# Patient Record
Sex: Female | Born: 1955 | Race: White | Hispanic: No | Marital: Married | Smoking: Current every day smoker
Health system: Southern US, Community
[De-identification: ages and names within clinical notes are randomized; demographics above are authoritative.]

## PROBLEM LIST (undated history)

## (undated) DIAGNOSIS — Z8719 Personal history of other diseases of the digestive system: Secondary | ICD-10-CM

## (undated) DIAGNOSIS — K219 Gastro-esophageal reflux disease without esophagitis: Secondary | ICD-10-CM

## (undated) DIAGNOSIS — F319 Bipolar disorder, unspecified: Secondary | ICD-10-CM

## (undated) DIAGNOSIS — E039 Hypothyroidism, unspecified: Secondary | ICD-10-CM

## (undated) DIAGNOSIS — K5900 Constipation, unspecified: Secondary | ICD-10-CM

## (undated) DIAGNOSIS — R1310 Dysphagia, unspecified: Secondary | ICD-10-CM

## (undated) DIAGNOSIS — I10 Essential (primary) hypertension: Secondary | ICD-10-CM

## (undated) DIAGNOSIS — E7800 Pure hypercholesterolemia, unspecified: Secondary | ICD-10-CM

## (undated) DIAGNOSIS — Z972 Presence of dental prosthetic device (complete) (partial): Secondary | ICD-10-CM

## (undated) HISTORY — PX: SHOULDER SURGERY: SHX246

## (undated) HISTORY — PX: ABDOMINAL HYSTERECTOMY: SHX81

---

## 2006-09-14 HISTORY — PX: COLONOSCOPY: SHX174

## 2010-12-18 ENCOUNTER — Emergency Department: Payer: Self-pay | Admitting: Internal Medicine

## 2011-08-19 ENCOUNTER — Ambulatory Visit: Payer: Self-pay | Admitting: Family Medicine

## 2011-08-27 ENCOUNTER — Ambulatory Visit: Payer: Self-pay | Admitting: Family Medicine

## 2011-09-15 HISTORY — PX: BREAST SURGERY: SHX581

## 2012-02-22 ENCOUNTER — Ambulatory Visit: Payer: Self-pay | Admitting: Surgery

## 2012-04-11 ENCOUNTER — Ambulatory Visit: Payer: Self-pay | Admitting: Surgery

## 2012-09-14 HISTORY — PX: BREAST EXCISIONAL BIOPSY: SUR124

## 2013-04-11 ENCOUNTER — Emergency Department: Payer: Self-pay | Admitting: Emergency Medicine

## 2013-08-29 ENCOUNTER — Emergency Department: Payer: Self-pay | Admitting: Emergency Medicine

## 2013-09-24 ENCOUNTER — Emergency Department: Payer: Self-pay | Admitting: Emergency Medicine

## 2013-09-24 LAB — URINALYSIS, COMPLETE
BILIRUBIN, UR: NEGATIVE
Blood: NEGATIVE
Glucose,UR: NEGATIVE mg/dL (ref 0–75)
Ketone: NEGATIVE
Leukocyte Esterase: NEGATIVE
Nitrite: NEGATIVE
PROTEIN: NEGATIVE
Ph: 6 (ref 4.5–8.0)
RBC,UR: NONE SEEN /HPF (ref 0–5)
Specific Gravity: 1.01 (ref 1.003–1.030)

## 2014-04-18 ENCOUNTER — Ambulatory Visit: Payer: Self-pay | Admitting: Family Medicine

## 2015-01-01 NOTE — Op Note (Signed)
PATIENT NAME:  Brittany Guerra, BADMAN MR#:  [id number] DATE OF BIRTH:  [birth year]  DATE OF PROCEDURE:  04/11/2012  PREOPERATIVE DIAGNOSIS:  Cluster of microcalcifications left breast.   POSTOPERATIVE DIAGNOSIS:  Cluster of microcalcifications left breast.   PROCEDURE: Excision of left breast mass.   SURGEON: Rochel Brome, M.D.   ANESTHESIA: General.   INDICATIONS: This 59 year old female had a recent mammogram which depicted a small cluster of microcalcifications in the upper outer quadrant, left breast. X-ray needle localization and excision was recommended for further evaluation.   DESCRIPTION OF PROCEDURE: The patient was placed on the operating table in the supine position under general anesthesia. The dressing was removed from the left breast, exposing the Kopans wire which entered the breast in the upper outer quadrant at approximately the one o'clock position in the peripheral aspect of the breast. The wire was cut 3 cm from the skin. The breast was prepared with ChloraPrep and draped in a sterile manner. Mammograms were reviewed prior to incision. The microcalcifications appeared to be near the junction of the thickener with the wire most proximal.   A curvilinear incision was made in the upper outer quadrant of the left breast approximately 4 cm in length, carried down through subcutaneous tissues. Numerous small bleeding points were cauterized. Two bleeding points were suture ligated with 4-0 chromic. The incision was carried down to encounter the thin part of the wire and delivered the wire up into the wound. Next, a sample of tissue which was approximately 2 x 2 x 4 cm in dimension was removed including the wire. It was submitted for specimen mammogram. The wound was inspected. Several small bleeding points were cauterized. Hemostasis was subsequently intact. The subcutaneous tissues and some deeper tissues were infiltrated with 0.5% Sensorcaine with epinephrine. Subcutaneous tissues were  closed with 4-0 chromic and the skin was closed with running 5-0 Monocryl subcuticular suture and Dermabond. The specimen mammogram did demonstrate the microcalcifications and tissues were submitted for routine pathology.   The patient tolerated the procedure satisfactorily and was prepared for transfer to the recovery room.   ____________________________ Lenna Sciara. Rochel Brome, MD jws:bjt D: 04/11/2012 11:19:34 ET T: 04/11/2012 12:47:55 ET JOB#: [id number]  cc: Loreli Dollar, MD, <Dictator> Loreli Dollar MD ELECTRONICALLY SIGNED 04/16/2012 10:43

## 2015-06-06 ENCOUNTER — Other Ambulatory Visit: Payer: Self-pay | Admitting: Family Medicine

## 2015-06-06 DIAGNOSIS — R1310 Dysphagia, unspecified: Secondary | ICD-10-CM

## 2015-06-17 ENCOUNTER — Ambulatory Visit: Payer: Self-pay

## 2015-06-27 ENCOUNTER — Ambulatory Visit: Payer: Self-pay

## 2015-07-03 ENCOUNTER — Ambulatory Visit: Payer: Self-pay

## 2015-07-05 ENCOUNTER — Ambulatory Visit: Payer: Self-pay | Attending: Family Medicine

## 2016-01-24 ENCOUNTER — Encounter: Payer: Self-pay | Admitting: *Deleted

## 2016-01-28 NOTE — Discharge Instructions (Signed)

## 2016-01-29 ENCOUNTER — Encounter
Admission: RE | Disposition: A | Discharge status: Home / Self Care | Payer: Self-pay | Source: Ambulatory Visit | Attending: Gastroenterology

## 2016-01-29 ENCOUNTER — Ambulatory Visit: Admitting: Anesthesiology

## 2016-01-29 ENCOUNTER — Ambulatory Visit
Admission: RE | Admit: 2016-01-29 | Discharge: 2016-01-29 | Disposition: A | Discharge status: Home / Self Care | Source: Ambulatory Visit | Attending: Gastroenterology | Admitting: Gastroenterology

## 2016-01-29 DIAGNOSIS — I10 Essential (primary) hypertension: Secondary | ICD-10-CM | POA: Diagnosis not present

## 2016-01-29 DIAGNOSIS — E039 Hypothyroidism, unspecified: Secondary | ICD-10-CM | POA: Insufficient documentation

## 2016-01-29 DIAGNOSIS — E7800 Pure hypercholesterolemia, unspecified: Secondary | ICD-10-CM | POA: Insufficient documentation

## 2016-01-29 DIAGNOSIS — R1310 Dysphagia, unspecified: Secondary | ICD-10-CM | POA: Insufficient documentation

## 2016-01-29 DIAGNOSIS — D123 Benign neoplasm of transverse colon: Secondary | ICD-10-CM | POA: Insufficient documentation

## 2016-01-29 DIAGNOSIS — F319 Bipolar disorder, unspecified: Secondary | ICD-10-CM | POA: Diagnosis not present

## 2016-01-29 DIAGNOSIS — Z7982 Long term (current) use of aspirin: Secondary | ICD-10-CM | POA: Insufficient documentation

## 2016-01-29 DIAGNOSIS — Z90710 Acquired absence of both cervix and uterus: Secondary | ICD-10-CM | POA: Insufficient documentation

## 2016-01-29 DIAGNOSIS — K219 Gastro-esophageal reflux disease without esophagitis: Secondary | ICD-10-CM | POA: Insufficient documentation

## 2016-01-29 DIAGNOSIS — Z79899 Other long term (current) drug therapy: Secondary | ICD-10-CM | POA: Insufficient documentation

## 2016-01-29 DIAGNOSIS — K5900 Constipation, unspecified: Secondary | ICD-10-CM | POA: Diagnosis present

## 2016-01-29 DIAGNOSIS — Z882 Allergy status to sulfonamides status: Secondary | ICD-10-CM | POA: Diagnosis not present

## 2016-01-29 DIAGNOSIS — F17200 Nicotine dependence, unspecified, uncomplicated: Secondary | ICD-10-CM | POA: Diagnosis not present

## 2016-01-29 DIAGNOSIS — E785 Hyperlipidemia, unspecified: Secondary | ICD-10-CM | POA: Diagnosis not present

## 2016-01-29 HISTORY — DX: Essential (primary) hypertension: I10

## 2016-01-29 HISTORY — PX: COLONOSCOPY: SHX5424

## 2016-01-29 HISTORY — DX: Gastro-esophageal reflux disease without esophagitis: K21.9

## 2016-01-29 HISTORY — DX: Pure hypercholesterolemia, unspecified: E78.00

## 2016-01-29 HISTORY — DX: Bipolar disorder, unspecified: F31.9

## 2016-01-29 HISTORY — PX: ESOPHAGOGASTRODUODENOSCOPY: SHX5428

## 2016-01-29 HISTORY — DX: Hypothyroidism, unspecified: E03.9

## 2016-01-29 HISTORY — DX: Constipation, unspecified: K59.00

## 2016-01-29 HISTORY — DX: Personal history of other diseases of the digestive system: Z87.19

## 2016-01-29 HISTORY — DX: Presence of dental prosthetic device (complete) (partial): Z97.2

## 2016-01-29 HISTORY — DX: Dysphagia, unspecified: R13.10

## 2016-01-29 SURGERY — COLONOSCOPY
Anesthesia: Monitor Anesthesia Care | Wound class: Clean Contaminated

## 2016-01-29 MED ORDER — LACTATED RINGERS IV SOLN
INTRAVENOUS | Status: DC
  Administered 2016-01-29: 11:00:00 via INTRAVENOUS

## 2016-01-29 MED ORDER — PROPOFOL 10 MG/ML IV BOLUS
INTRAVENOUS | Status: DC | PRN
  Administered 2016-01-29 (×8): 20 mg via INTRAVENOUS

## 2016-01-29 MED ORDER — STERILE WATER FOR IRRIGATION IR SOLN
Status: DC | PRN
  Administered 2016-01-29: 400 mL

## 2016-01-29 SURGICAL SUPPLY — 41 items
BALLN DILATOR 10-12 8 (BALLOONS)
BALLN DILATOR 12-15 8 (BALLOONS)
BALLN DILATOR 15-18 8 (BALLOONS)
BALLN DILATOR CRE 0-12 8 (BALLOONS)
BALLN DILATOR ESOPH 8 10 CRE (MISCELLANEOUS) IMPLANT
BALLOON DILATOR 12-15 8 (BALLOONS) IMPLANT
BALLOON DILATOR 15-18 8 (BALLOONS) IMPLANT
BALLOON DILATOR CRE 0-12 8 (BALLOONS) IMPLANT
BLOCK BITE 60FR ADLT L/F GRN (MISCELLANEOUS) ×3 IMPLANT
CANISTER SUCT 1200ML W/VALVE (MISCELLANEOUS) ×3 IMPLANT
FCP ESCP3.2XJMB 240X2.8X (MISCELLANEOUS)
FORCEPS BIOP RAD 4 LRG CAP 4 (CUTTING FORCEPS) IMPLANT
FORCEPS BIOP RJ4 240 W/NDL (MISCELLANEOUS)
FORCEPS ESCP3.2XJMB 240X2.8X (MISCELLANEOUS) IMPLANT
GOWN CVR UNV OPN BCK APRN NK (MISCELLANEOUS) ×1 IMPLANT
GOWN ISOL THUMB LOOP REG UNIV (MISCELLANEOUS) ×2
GOWN STRL REUS W/ TWL LRG LVL3 (GOWN DISPOSABLE) ×1 IMPLANT
GOWN STRL REUS W/TWL LRG LVL3 (GOWN DISPOSABLE) ×2
HEMOCLIP INSTINCT (CLIP) IMPLANT
INJECTOR VARIJECT VIN23 (MISCELLANEOUS) IMPLANT
KIT CO2 TUBING (TUBING) IMPLANT
KIT DEFENDO VALVE AND CONN (KITS) IMPLANT
KIT ENDO PROCEDURE OLY (KITS) ×3 IMPLANT
LIGATOR MULTIBAND 6SHOOTER MBL (MISCELLANEOUS) IMPLANT
MARKER SPOT ENDO TATTOO 5ML (MISCELLANEOUS) IMPLANT
PAD GROUND ADULT SPLIT (MISCELLANEOUS) IMPLANT
SNARE SHORT THROW 13M SML OVAL (MISCELLANEOUS) ×3 IMPLANT
SNARE SHORT THROW 30M LRG OVAL (MISCELLANEOUS) IMPLANT
SPOT EX ENDOSCOPIC TATTOO (MISCELLANEOUS)
SUCTION POLY TRAP 4CHAMBER (MISCELLANEOUS) IMPLANT
SYR INFLATION 60ML (SYRINGE) IMPLANT
TRAP SUCTION POLY (MISCELLANEOUS) IMPLANT
TUBING CONN 6MMX3.1M (TUBING)
TUBING SUCTION CONN 0.25 STRL (TUBING) IMPLANT
UNDERPAD 30X60 958B10 (PK) (MISCELLANEOUS) IMPLANT
VALVE BIOPSY ENDO (VALVE) IMPLANT
VARIJECT INJECTOR VIN23 (MISCELLANEOUS)
WATER AUXILLARY (MISCELLANEOUS) IMPLANT
WATER STERILE IRR 250ML POUR (IV SOLUTION) ×3 IMPLANT
WATER STERILE IRR 500ML POUR (IV SOLUTION) IMPLANT
WIRE CRE 18-20MM 8CM F G (MISCELLANEOUS) IMPLANT

## 2016-01-29 NOTE — H&P (Signed)
Primary Care Physician:  Dion Body, MD Primary Gastroenterologist:  Dr. Candace Cruise  Pre-Procedure History & Physical: HPI:  Brittany Guerra is a 60 y.o. female is here for an EGD/colonoscopy  Past Medical History  Diagnosis Date  . Constipation     chronic  . Dysphagia   . GERD (gastroesophageal reflux disease)   . Hypercholesteremia   . Hypertension   . History of hiatal hernia   . Hypothyroidism   . Bipolar disorder ([place])   . Wears dentures     full upper and lower    Past Surgical History  Procedure Laterality Date  . Abdominal hysterectomy    . Breast surgery Left 2013    calcifications  . Colonoscopy  2008  . Shoulder surgery Left     Prior to Admission medications   Medication Sig Start Date End Date Taking? Authorizing Provider  albuterol (PROVENTIL HFA;VENTOLIN HFA) 108 (90 Base) MCG/ACT inhaler Inhale into the lungs every 6 (six) hours as needed for wheezing or shortness of breath.   Yes Historical Provider, MD  aspirin 81 MG tablet Take 81 mg by mouth daily.   Yes Historical Provider, MD  buPROPion (WELLBUTRIN SR) 100 MG 12 hr tablet Take 100 mg by mouth daily.   Yes Historical Provider, MD  DOCUSATE CALCIUM PO Take by mouth daily.   Yes Historical Provider, MD  esomeprazole (NEXIUM) 40 MG capsule Take 40 mg by mouth daily at 12 noon.   Yes Historical Provider, MD  levothyroxine (SYNTHROID, LEVOTHROID) 112 MCG tablet Take 112 mcg by mouth daily before breakfast.   Yes Historical Provider, MD  lithium 300 MG tablet Take 600 mg by mouth daily. Takes at bedtime   Yes Historical Provider, MD  metoprolol tartrate (LOPRESSOR) 25 MG tablet Take 25 mg by mouth 2 (two) times daily.   Yes Historical Provider, MD  risperiDONE (RISPERDAL) 1 MG tablet Take 1 mg by mouth at bedtime.   Yes Historical Provider, MD  sucralfate (CARAFATE) 1 g tablet Take 1 g by mouth 4 (four) times daily.   Yes Historical Provider, MD  TEMAZEPAM PO Take by mouth daily.   Yes Historical  Provider, MD  tolterodine (DETROL LA) 4 MG 24 hr capsule Take 4 mg by mouth daily.   Yes Historical Provider, MD    Allergies as of 12/06/2015  . (Not on File)    History reviewed. No pertinent family history.  Social History   Social History  . Marital Status: Married    Spouse Name: N/A  . Number of Children: N/A  . Years of Education: N/A   Occupational History  . Not on file.   Social History Main Topics  . Smoking status: Current Every Day Smoker    Types: E-cigarettes  . Smokeless tobacco: Former Systems developer    Quit date: 12/14/2011     Comment: Prior to 2013 smoked 3 PPD for 39 yrs.  Now uses "Vapes"  . Alcohol Use: 1.8 oz/week    3 Cans of beer per week  . Drug Use: Not on file  . Sexual Activity: Not on file   Other Topics Concern  . Not on file   Social History Narrative    Review of Systems: See HPI, otherwise negative ROS  Physical Exam: BP 129/74 mmHg  Pulse 58  Temp(Src) 98.6 F (37 C) (Temporal)  Resp 16  Ht 5\' 6"  (1.676 m)  Wt 145 lb (65.772 kg)  BMI 23.41 kg/m2  SpO2 100% General:  Alert,  pleasant and cooperative in NAD Head:  Normocephalic and atraumatic. Neck:  Supple; no masses or thyromegaly. Lungs:  Clear throughout to auscultation.    Heart:  Regular rate and rhythm. Abdomen:  Soft, nontender and nondistended. Normal bowel sounds, without guarding, and without rebound.   Neurologic:  Alert and  oriented x4;  grossly normal neurologically.  Impression/Plan: Brittany Guerra is here for an EGD/colonoscopy to be performed for GERD, dysphagia, chronic constipation  Risks, benefits, limitations, and alternatives regarding EGD/colonoscopy have been reviewed with the patient.  Questions have been answered.  All parties agreeable.   [name], [name], MD  01/29/2016, 11:33 AM

## 2016-01-29 NOTE — Op Note (Signed)
[facility] Gastroenterology Patient Name: Brittany Guerra Procedure Date: 01/29/2016 11:34 AM MRN: [id number] Account #: [id number] Date of Birth: 1955-09-27 Admit Type: Outpatient Age: 60 Room: [facility] OR ROOM 01 Gender: Female Note Status: Finalized Procedure:            Upper GI endoscopy Indications:          Dysphagia, Suspected esophageal reflux Providers:            [name]. Candace Cruise, MD Referring MD:         Dion Body (Referring MD) Medicines:            Monitored Anesthesia Care Complications:        No immediate complications. Procedure:            Pre-Anesthesia Assessment:                       - Prior to the procedure, a History and Physical was                        performed, and patient medications, allergies and                        sensitivities were reviewed. The patient's tolerance of                        previous anesthesia was reviewed.                       - The risks and benefits of the procedure and the                        sedation options and risks were discussed with the                        patient. All questions were answered and informed                        consent was obtained.                       - After reviewing the risks and benefits, the patient                        was deemed in satisfactory condition to undergo the                        procedure.                       After obtaining informed consent, the endoscope was                        passed under direct vision. Throughout the procedure,                        the patient's blood pressure, pulse, and oxygen                        saturations were monitored continuously. The Olympus  GIF H180J endoscope (S#: [id number]) was introduced                        through the mouth, and advanced to the second part of                        duodenum. The upper GI endoscopy was accomplished                        without  difficulty. Findings:      No endoscopic abnormality was evident in the esophagus to explain the       patient's complaint of dysphagia. It was decided, however, to proceed       with dilation of the entire esophagus. The scope was withdrawn. Dilation       was performed with a Maloney dilator with mild resistance at 65 Fr.      The exam was otherwise without abnormality.      The entire examined stomach was normal.      The examined duodenum was normal. Impression:           - No endoscopic esophageal abnormality to explain                        patient's dysphagia. Esophagus dilated. Dilated.                       - The examination was otherwise normal.                       - Normal stomach.                       - Normal examined duodenum.                       - No specimens collected. Recommendation:       - Discharge patient to home.                       - Observe patient's clinical course.                       - The findings and recommendations were discussed with                        the patient. Procedure Code(s):    --- Professional ---                       [phone], Esophagogastroduodenoscopy, flexible, transoral;                        diagnostic, including collection of specimen(s) by                        brushing or washing, when performed (separate procedure)                       43450, Dilation of esophagus, by unguided sound or                        bougie, single or multiple passes Diagnosis Code(s):    --- Professional ---  R13.10, Dysphagia, unspecified CPT copyright 2016 American Medical Association. All rights reserved. The codes documented in this report are preliminary and upon coder review may  be revised to meet current compliance requirements. Hulen Luster, MD 01/29/2016 11:44:56 AM This report has been signed electronically. Number of Addenda: 0 Note Initiated On: 01/29/2016 11:34 AM      [facility]

## 2016-01-29 NOTE — Anesthesia Postprocedure Evaluation (Signed)
Anesthesia Post Note  Patient: Brittany Guerra  Procedure(s) Performed: Procedure(s) (LRB): COLONOSCOPY (N/A) ESOPHAGOGASTRODUODENOSCOPY (EGD) (N/A)  Patient location during evaluation: PACU Anesthesia Type: MAC Level of consciousness: awake and alert Pain management: pain level controlled Vital Signs Assessment: post-procedure vital signs reviewed and stable Respiratory status: spontaneous breathing, nonlabored ventilation, respiratory function stable and patient connected to nasal cannula oxygen Cardiovascular status: stable and blood pressure returned to baseline Anesthetic complications: no    Amaryllis Dyke

## 2016-01-29 NOTE — Transfer of Care (Signed)
Immediate Anesthesia Transfer of Care Note  Patient: Brittany Guerra  Procedure(s) Performed: Procedure(s): COLONOSCOPY (N/A) ESOPHAGOGASTRODUODENOSCOPY (EGD) (N/A)  Patient Location: PACU  Anesthesia Type: MAC  Level of Consciousness: awake, alert  and patient cooperative  Airway and Oxygen Therapy: Patient Spontanous Breathing and Patient connected to supplemental oxygen  Post-op Assessment: Post-op Vital signs reviewed, Patient's Cardiovascular Status Stable, Respiratory Function Stable, Patent Airway and No signs of Nausea or vomiting  Post-op Vital Signs: Reviewed and stable  Complications: No apparent anesthesia complications

## 2016-01-29 NOTE — Anesthesia Preprocedure Evaluation (Signed)
Anesthesia Evaluation  Patient identified by MRN, date of birth, ID band Patient awake    Reviewed: Allergy & Precautions, H&P , NPO status   Airway Mallampati: II  TM Distance: >3 FB Neck ROM: full    Dental  (+) Upper Dentures, Lower Dentures   Pulmonary Current Smoker,    Pulmonary exam normal        Cardiovascular hypertension, Normal cardiovascular exam     Neuro/Psych    GI/Hepatic hiatal hernia, GERD  ,  Endo/Other  Hypothyroidism   Renal/GU      Musculoskeletal   Abdominal   Peds  Hematology   Anesthesia Other Findings   Reproductive/Obstetrics                             Anesthesia Physical Anesthesia Plan  ASA: II  Anesthesia Plan: MAC   Post-op Pain Management:    Induction:   Airway Management Planned:   Additional Equipment:   Intra-op Plan:   Post-operative Plan:   Informed Consent: I have reviewed the patients History and Physical, chart, labs and discussed the procedure including the risks, benefits and alternatives for the proposed anesthesia with the patient or authorized representative who has indicated his/her understanding and acceptance.     Plan Discussed with: CRNA  Anesthesia Plan Comments:         Anesthesia Quick Evaluation

## 2016-01-29 NOTE — Op Note (Signed)
[facility] Gastroenterology Patient Name: Brittany Guerra Procedure Date: 01/29/2016 11:45 AM MRN: [id number] Account #: [id number] Date of Birth: 1955/12/15 Admit Type: Outpatient Age: 60 Room: [facility] OR ROOM 01 Gender: Female Note Status: Finalized Procedure:            Colonoscopy Indications:          Constipation Providers:            [name]. Candace Cruise, MD Medicines:            Monitored Anesthesia Care Complications:        No immediate complications. Procedure:            Pre-Anesthesia Assessment:                       - Prior to the procedure, a History and Physical was                        performed, and patient medications, allergies and                        sensitivities were reviewed. The patient's tolerance of                        previous anesthesia was reviewed.                       - The risks and benefits of the procedure and the                        sedation options and risks were discussed with the                        patient. All questions were answered and informed                        consent was obtained.                       - After reviewing the risks and benefits, the patient                        was deemed in satisfactory condition to undergo the                        procedure.                       After obtaining informed consent, the colonoscope was                        passed under direct vision. Throughout the procedure,                        the patient's blood pressure, pulse, and oxygen                        saturations were monitored continuously. The Olympus CF                        H180AL colonoscope (S#: [id number]) was introduced through  the anus and advanced to the the cecum, identified by                        appendiceal orifice and ileocecal valve. The                        colonoscopy was performed without difficulty. The                        patient tolerated the procedure well.  The quality of                        the bowel preparation was fair. Findings:      A small polyp was found in the hepatic flexure. The polyp was sessile.       The polyp was removed with a cold snare. Resection and retrieval were       complete.      The exam was otherwise without abnormality. Impression:           - Preparation of the colon was fair.                       - One small polyp at the hepatic flexure, removed with                        a cold snare. Resected and retrieved.                       - The examination was otherwise normal. Recommendation:       - Discharge patient to home.                       - Await pathology results.                       - Repeat colonoscopy in 5 years for surveillance based                        on pathology results.                       - The findings and recommendations were discussed with                        the patient. Procedure Code(s):    --- Professional ---                       [phone], Colonoscopy, flexible; with removal of tumor(s),                        polyp(s), or other lesion(s) by snare technique Diagnosis Code(s):    --- Professional ---                       D12.3, Benign neoplasm of transverse colon (hepatic                        flexure or splenic flexure)                       K59.00, Constipation, unspecified CPT copyright 2016 American  Medical Association. All rights reserved. The codes documented in this report are preliminary and upon coder review may  be revised to meet current compliance requirements. Hulen Luster, MD 01/29/2016 12:00:58 PM This report has been signed electronically. Number of Addenda: 0 Note Initiated On: 01/29/2016 11:45 AM Scope Withdrawal Time: 0 hours 6 minutes 1 second  Total Procedure Duration: 0 hours 11 minutes 43 seconds       [facility]

## 2016-01-30 ENCOUNTER — Encounter: Payer: Self-pay | Admitting: Gastroenterology

## 2016-01-31 LAB — SURGICAL PATHOLOGY

## 2016-02-13 ENCOUNTER — Other Ambulatory Visit: Payer: Self-pay | Admitting: Family Medicine

## 2016-02-13 DIAGNOSIS — Z1231 Encounter for screening mammogram for malignant neoplasm of breast: Secondary | ICD-10-CM

## 2016-02-28 ENCOUNTER — Ambulatory Visit

## 2016-03-13 ENCOUNTER — Ambulatory Visit

## 2016-03-31 ENCOUNTER — Ambulatory Visit
Admission: RE | Admit: 2016-03-31 | Discharge: 2016-03-31 | Disposition: A | Discharge status: Home / Self Care | Source: Ambulatory Visit | Attending: Family Medicine | Admitting: Family Medicine

## 2016-03-31 DIAGNOSIS — Z1231 Encounter for screening mammogram for malignant neoplasm of breast: Secondary | ICD-10-CM | POA: Insufficient documentation

## 2017-02-17 ENCOUNTER — Other Ambulatory Visit: Payer: Self-pay | Admitting: Family Medicine

## 2017-02-17 DIAGNOSIS — Z1231 Encounter for screening mammogram for malignant neoplasm of breast: Secondary | ICD-10-CM

## 2017-04-02 ENCOUNTER — Ambulatory Visit
Admission: RE | Admit: 2017-04-02 | Discharge: 2017-04-02 | Disposition: A | Discharge status: Home / Self Care | Source: Ambulatory Visit | Attending: Family Medicine | Admitting: Family Medicine

## 2017-04-02 DIAGNOSIS — Z1231 Encounter for screening mammogram for malignant neoplasm of breast: Secondary | ICD-10-CM | POA: Insufficient documentation

## 2017-04-05 ENCOUNTER — Other Ambulatory Visit: Payer: Self-pay | Admitting: Family Medicine

## 2017-04-05 DIAGNOSIS — R590 Localized enlarged lymph nodes: Secondary | ICD-10-CM

## 2017-04-05 DIAGNOSIS — N6320 Unspecified lump in the left breast, unspecified quadrant: Secondary | ICD-10-CM

## 2017-04-05 DIAGNOSIS — R928 Other abnormal and inconclusive findings on diagnostic imaging of breast: Secondary | ICD-10-CM

## 2017-04-14 ENCOUNTER — Ambulatory Visit
Admission: RE | Admit: 2017-04-14 | Discharge: 2017-04-14 | Disposition: A | Discharge status: Home / Self Care | Source: Ambulatory Visit | Attending: Family Medicine | Admitting: Family Medicine

## 2017-04-14 DIAGNOSIS — R928 Other abnormal and inconclusive findings on diagnostic imaging of breast: Secondary | ICD-10-CM

## 2017-04-14 DIAGNOSIS — N6320 Unspecified lump in the left breast, unspecified quadrant: Secondary | ICD-10-CM

## 2017-04-14 DIAGNOSIS — R590 Localized enlarged lymph nodes: Secondary | ICD-10-CM

## 2017-04-14 HISTORY — PX: BREAST BIOPSY: SHX20

## 2017-04-19 ENCOUNTER — Other Ambulatory Visit: Payer: Self-pay | Admitting: Family Medicine

## 2017-04-19 DIAGNOSIS — R928 Other abnormal and inconclusive findings on diagnostic imaging of breast: Secondary | ICD-10-CM

## 2017-04-26 ENCOUNTER — Ambulatory Visit
Admission: RE | Admit: 2017-04-26 | Discharge: 2017-04-26 | Disposition: A | Discharge status: Home / Self Care | Source: Ambulatory Visit | Attending: Family Medicine | Admitting: Family Medicine

## 2017-04-26 DIAGNOSIS — R928 Other abnormal and inconclusive findings on diagnostic imaging of breast: Secondary | ICD-10-CM

## 2017-04-26 DIAGNOSIS — R599 Enlarged lymph nodes, unspecified: Secondary | ICD-10-CM | POA: Diagnosis present

## 2017-04-26 DIAGNOSIS — R590 Localized enlarged lymph nodes: Secondary | ICD-10-CM | POA: Insufficient documentation

## 2017-04-29 LAB — SURGICAL PATHOLOGY

## 2017-06-22 DIAGNOSIS — E782 Mixed hyperlipidemia: Secondary | ICD-10-CM | POA: Diagnosis present

## 2018-03-22 ENCOUNTER — Other Ambulatory Visit: Payer: Self-pay | Admitting: Family Medicine

## 2018-03-22 DIAGNOSIS — Z1231 Encounter for screening mammogram for malignant neoplasm of breast: Secondary | ICD-10-CM

## 2018-04-08 ENCOUNTER — Encounter

## 2018-04-19 ENCOUNTER — Encounter

## 2018-04-27 ENCOUNTER — Ambulatory Visit
Admission: RE | Admit: 2018-04-27 | Discharge: 2018-04-27 | Disposition: A | Discharge status: Home / Self Care | Source: Ambulatory Visit | Attending: Family Medicine | Admitting: Family Medicine

## 2018-04-27 DIAGNOSIS — Z1231 Encounter for screening mammogram for malignant neoplasm of breast: Secondary | ICD-10-CM | POA: Insufficient documentation

## 2018-04-30 ENCOUNTER — Other Ambulatory Visit: Payer: Self-pay

## 2018-04-30 ENCOUNTER — Emergency Department
Admission: EM | Admit: 2018-04-30 | Discharge: 2018-04-30 | Disposition: A | Discharge status: Home / Self Care | Attending: Emergency Medicine | Admitting: Emergency Medicine

## 2018-04-30 ENCOUNTER — Emergency Department

## 2018-04-30 DIAGNOSIS — I10 Essential (primary) hypertension: Secondary | ICD-10-CM | POA: Insufficient documentation

## 2018-04-30 DIAGNOSIS — M25551 Pain in right hip: Secondary | ICD-10-CM | POA: Insufficient documentation

## 2018-04-30 DIAGNOSIS — Z79899 Other long term (current) drug therapy: Secondary | ICD-10-CM | POA: Insufficient documentation

## 2018-04-30 DIAGNOSIS — F17290 Nicotine dependence, other tobacco product, uncomplicated: Secondary | ICD-10-CM | POA: Diagnosis not present

## 2018-04-30 DIAGNOSIS — E039 Hypothyroidism, unspecified: Secondary | ICD-10-CM | POA: Diagnosis not present

## 2018-04-30 DIAGNOSIS — Z7982 Long term (current) use of aspirin: Secondary | ICD-10-CM | POA: Diagnosis not present

## 2018-04-30 LAB — GLUCOSE, CAPILLARY: GLUCOSE-CAPILLARY: 98 mg/dL (ref 70–99)

## 2018-04-30 MED ORDER — PREDNISONE 50 MG PO TABS: ORAL_TABLET | ORAL | 0 refills | Status: DC

## 2018-04-30 MED ORDER — PREDNISONE 20 MG PO TABS
60.0000 mg | ORAL_TABLET | Freq: Once | ORAL | Status: AC
  Administered 2018-04-30: 60 mg via ORAL
  Filled 2018-04-30: qty 3

## 2018-04-30 NOTE — ED Triage Notes (Signed)
Patient reports having pain from right foot up to right hip pain.  Denies any type of injury.  Patient is ambulatory from waiting room to triage without difficulty or distress noted.

## 2018-04-30 NOTE — ED Provider Notes (Signed)
[facility] Emergency Department Provider Note  ____________________________________________  Time seen: Approximately 10:34 PM  I have reviewed the triage vital signs and the nursing notes.   HISTORY  Chief Complaint Hip Pain and Foot Pain    HPI Brittany Guerra is a 62 y.o. female presents to the emergency department with right hip pain for the past 3 to 4 days.  Patient describes the pain as being "at the underwear line".  Patient denies falls or mechanisms of trauma.  Patient does report that the pain radiates along the anterior thigh.  She has not experienced similar symptoms in the past.  Patient states that pain improves with continued walking and worsens with sitting position.  Patient has not taken medications to relieve her symptoms.    Past Medical History:  Diagnosis Date  . Bipolar disorder ([place])   . Constipation    chronic  . Dysphagia   . GERD (gastroesophageal reflux disease)   . History of hiatal hernia   . Hypercholesteremia   . Hypertension   . Hypothyroidism   . Wears dentures    full upper and lower    There are no active problems to display for this patient.   Past Surgical History:  Procedure Laterality Date  . ABDOMINAL HYSTERECTOMY    . BREAST EXCISIONAL BIOPSY Left 2014   benign  . BREAST SURGERY Left 2013   calcifications  . COLONOSCOPY  2008  . COLONOSCOPY N/A 01/29/2016   Procedure: COLONOSCOPY;  Surgeon: Hulen Luster, MD;  Location: [place];  Service: Gastroenterology;  Laterality: N/A;  . ESOPHAGOGASTRODUODENOSCOPY N/A 01/29/2016   Procedure: ESOPHAGOGASTRODUODENOSCOPY (EGD);  Surgeon: Hulen Luster, MD;  Location: [place];  Service: Gastroenterology;  Laterality: N/A;  . SHOULDER SURGERY Left     Prior to Admission medications   Medication Sig Start Date End Date Taking? Authorizing Provider  albuterol (PROVENTIL HFA;VENTOLIN HFA) 108 (90 Base) MCG/ACT inhaler Inhale into the lungs every 6  (six) hours as needed for wheezing or shortness of breath.    [provider]  aspirin 81 MG tablet Take 81 mg by mouth daily.    [provider]  buPROPion (WELLBUTRIN SR) 100 MG 12 hr tablet Take 100 mg by mouth daily.    [provider]  DOCUSATE CALCIUM PO Take by mouth daily.    [provider]  esomeprazole (NEXIUM) 40 MG capsule Take 40 mg by mouth daily at 12 noon.    [provider]  levothyroxine (SYNTHROID, LEVOTHROID) 112 MCG tablet Take 112 mcg by mouth daily before breakfast.    [provider]  lithium 300 MG tablet Take 600 mg by mouth daily. Takes at bedtime    [provider]  metoprolol tartrate (LOPRESSOR) 25 MG tablet Take 25 mg by mouth 2 (two) times daily.    [provider]  predniSONE (DELTASONE) 50 MG tablet Take one 50 mg tablet once daily for the next five days. 04/30/18   Lannie Fields, PA-C  risperiDONE (RISPERDAL) 1 MG tablet Take 1 mg by mouth at bedtime.    [provider]  sucralfate (CARAFATE) 1 g tablet Take 1 g by mouth 4 (four) times daily.    [provider]  TEMAZEPAM PO Take by mouth daily.    [provider]  tolterodine (DETROL LA) 4 MG 24 hr capsule Take 4 mg by mouth daily.    [provider]    Allergies Sulfa antibiotics  Family  History  Problem Relation Age of Onset  . Breast cancer Maternal Grandmother 68    Social History Social History   Tobacco Use  . Smoking status: Current Every Day Smoker    Types: E-cigarettes  . Smokeless tobacco: Former Systems developer    Quit date: 12/14/2011  . Tobacco comment: Prior to 2013 smoked 3 PPD for 39 yrs.  Now uses "Vapes"  Substance Use Topics  . Alcohol use: Yes    Alcohol/week: 3.0 standard drinks    Types: 3 Cans of beer per week  . Drug use: Not on file     Review of Systems  Constitutional: No fever/chills Eyes: No visual changes. No discharge ENT: No upper respiratory  complaints. Cardiovascular: no chest pain. Respiratory: no cough. No SOB. Gastrointestinal: No abdominal pain.  No nausea, no vomiting.  No diarrhea.  No constipation. Musculoskeletal: Patient has right hip pain.  Skin: Negative for rash, abrasions, lacerations, ecchymosis. Neurological: Negative for headaches, focal weakness or numbness.   ____________________________________________   PHYSICAL EXAM:  VITAL SIGNS: ED Triage Vitals  Enc Vitals Group     BP 04/30/18 2050 (!) 160/95     Pulse Rate 04/30/18 2050 73     Resp 04/30/18 2050 20     Temp 04/30/18 2050 98.8 F (37.1 C)     Temp Source 04/30/18 2050 Oral     SpO2 04/30/18 2050 95 %     Weight 04/30/18 2051 143 lb (64.9 kg)     Height 04/30/18 2051 5\' 7"  (1.702 m)     Head Circumference --      Peak Flow --      Pain Score 04/30/18 2051 0     Pain Loc --      Pain Edu? --      Excl. in [place]? --      Constitutional: Alert and oriented. Well appearing and in no acute distress. Eyes: Conjunctivae are normal. PERRL. EOMI. Head: Atraumatic. Cardiovascular: Normal rate, regular rhythm. Normal S1 and S2.  Good peripheral circulation. Respiratory: Normal respiratory effort without tachypnea or retractions. Lungs CTAB. Good air entry to the bases with no decreased or absent breath sounds. Musculoskeletal: Patient has very little passive internal and external rotation at the right hip.  Pain is reproduced with internal and external rotation of the right hip.  Negative straight leg raise, right.  Patient performs full range of motion at the right knee and right ankle.  Palpable dorsalis pedis pulse, right. Neurologic:  Normal speech and language. No gross focal neurologic deficits are appreciated.  Skin:  Skin is warm, dry and intact. No rash noted. Psychiatric: Mood and affect are normal. Speech and behavior are normal. Patient exhibits appropriate insight and judgement.   ____________________________________________    LABS (all labs ordered are listed, but only abnormal results are displayed)  Labs Reviewed  GLUCOSE, CAPILLARY  CBG MONITORING, ED   ____________________________________________  EKG   ____________________________________________  RADIOLOGY I personally viewed and evaluated these images as part of my medical decision making, as well as reviewing the written report by the radiologist  Dg Hip Unilat W Or Wo Pelvis 2-3 Views Right  Result Date: 04/30/2018 CLINICAL DATA:  Initial evaluation for acute right hip pain. Concern for AVN. EXAM: DG HIP (WITH OR WITHOUT PELVIS) 2-3V RIGHT COMPARISON:  None. FINDINGS: Femoral heads in normal alignment within the acetabula. No acute fracture or dislocation. Bony pelvis intact. Femoral head height maintained without flattening, sclerosis, or fragmentation to suggest AVN. SI joints  approximated symmetric. No acute soft tissue abnormality. IMPRESSION: 1. No acute osseous abnormality about the right hip. 2. No radiographic evidence for AVN. Electronically Signed   By: Jeannine Boga M.D.   On: 04/30/2018 22:15    ____________________________________________    PROCEDURES  Procedure(s) performed:    Procedures    Medications  predniSONE (DELTASONE) tablet 60 mg (has no administration in time range)     ____________________________________________   INITIAL IMPRESSION / ASSESSMENT AND PLAN / ED COURSE  Pertinent labs & imaging results that were available during my care of the patient were reviewed by me and considered in my medical decision making (see chart for details).  Review of the  [place]RS was performed in accordance of the [place] prior to dispensing any controlled drugs.      Assessment and plan Right hip pain Patient presents to the emergency department with severe right hip pain for the past 3 to 4 days.  Differential diagnosis includes arthritis of the right hip, acetabulum tear and early AVN.  X-ray examination of the  right hip was reassuring without bony abnormality.  Patient was started empirically on prednisone given little findings on x-ray to support AVN diagnosis.  Patient was advised to follow-up with orthopedics if symptoms persist.  All patient questions were answered.    ____________________________________________  FINAL CLINICAL IMPRESSION(S) / ED DIAGNOSES  Final diagnoses:  Right hip pain      NEW MEDICATIONS STARTED DURING THIS VISIT:  ED Discharge Orders         Ordered    predniSONE (DELTASONE) 50 MG tablet     04/30/18 2232              This chart was dictated using voice recognition software/Dragon. Despite best efforts to proofread, errors can occur which can change the meaning. Any change was purely unintentional.    Karren Cobble 04/30/18 2239    Hinda Kehr, MD 05/01/18 0000

## 2019-03-22 ENCOUNTER — Other Ambulatory Visit: Payer: Self-pay | Admitting: Family Medicine

## 2019-03-22 DIAGNOSIS — Z1231 Encounter for screening mammogram for malignant neoplasm of breast: Secondary | ICD-10-CM

## 2019-05-02 ENCOUNTER — Encounter

## 2019-05-19 ENCOUNTER — Other Ambulatory Visit: Payer: Self-pay | Admitting: Sports Medicine

## 2019-05-19 DIAGNOSIS — M47816 Spondylosis without myelopathy or radiculopathy, lumbar region: Secondary | ICD-10-CM

## 2019-05-19 DIAGNOSIS — G8929 Other chronic pain: Secondary | ICD-10-CM

## 2019-05-19 DIAGNOSIS — M5442 Lumbago with sciatica, left side: Secondary | ICD-10-CM

## 2019-06-02 ENCOUNTER — Ambulatory Visit
Admission: RE | Admit: 2019-06-02 | Discharge: 2019-06-02 | Disposition: A | Discharge status: Home / Self Care | Source: Ambulatory Visit | Attending: Sports Medicine | Admitting: Sports Medicine

## 2019-06-02 ENCOUNTER — Encounter

## 2019-06-02 ENCOUNTER — Other Ambulatory Visit: Payer: Self-pay

## 2019-06-02 DIAGNOSIS — M47816 Spondylosis without myelopathy or radiculopathy, lumbar region: Secondary | ICD-10-CM | POA: Diagnosis present

## 2019-06-02 DIAGNOSIS — M5441 Lumbago with sciatica, right side: Secondary | ICD-10-CM | POA: Diagnosis present

## 2019-06-02 DIAGNOSIS — M5442 Lumbago with sciatica, left side: Secondary | ICD-10-CM | POA: Diagnosis not present

## 2019-06-02 DIAGNOSIS — G8929 Other chronic pain: Secondary | ICD-10-CM | POA: Diagnosis present

## 2019-07-18 ENCOUNTER — Ambulatory Visit
Admission: RE | Admit: 2019-07-18 | Discharge: 2019-07-18 | Disposition: A | Discharge status: Home / Self Care | Source: Ambulatory Visit | Attending: Family Medicine | Admitting: Family Medicine

## 2019-07-18 DIAGNOSIS — Z1231 Encounter for screening mammogram for malignant neoplasm of breast: Secondary | ICD-10-CM | POA: Diagnosis not present

## 2019-07-18 IMAGING — CR DG HIP (WITH OR WITHOUT PELVIS) 2-3V*R*
1 series · 3 of 3 positions shown · non-contrast
Comparison: None.

CLINICAL DATA: Initial evaluation for acute right hip pain. Concern
for AVN.

EXAM:
DG HIP (WITH OR WITHOUT PELVIS) 2-3V RIGHT

[Series 1: dg hip unilat w or w/o pelvis 2-3 views  · non-contrast · 0.14mm/px · 3 of 3 slices shown]
[im 1/3]
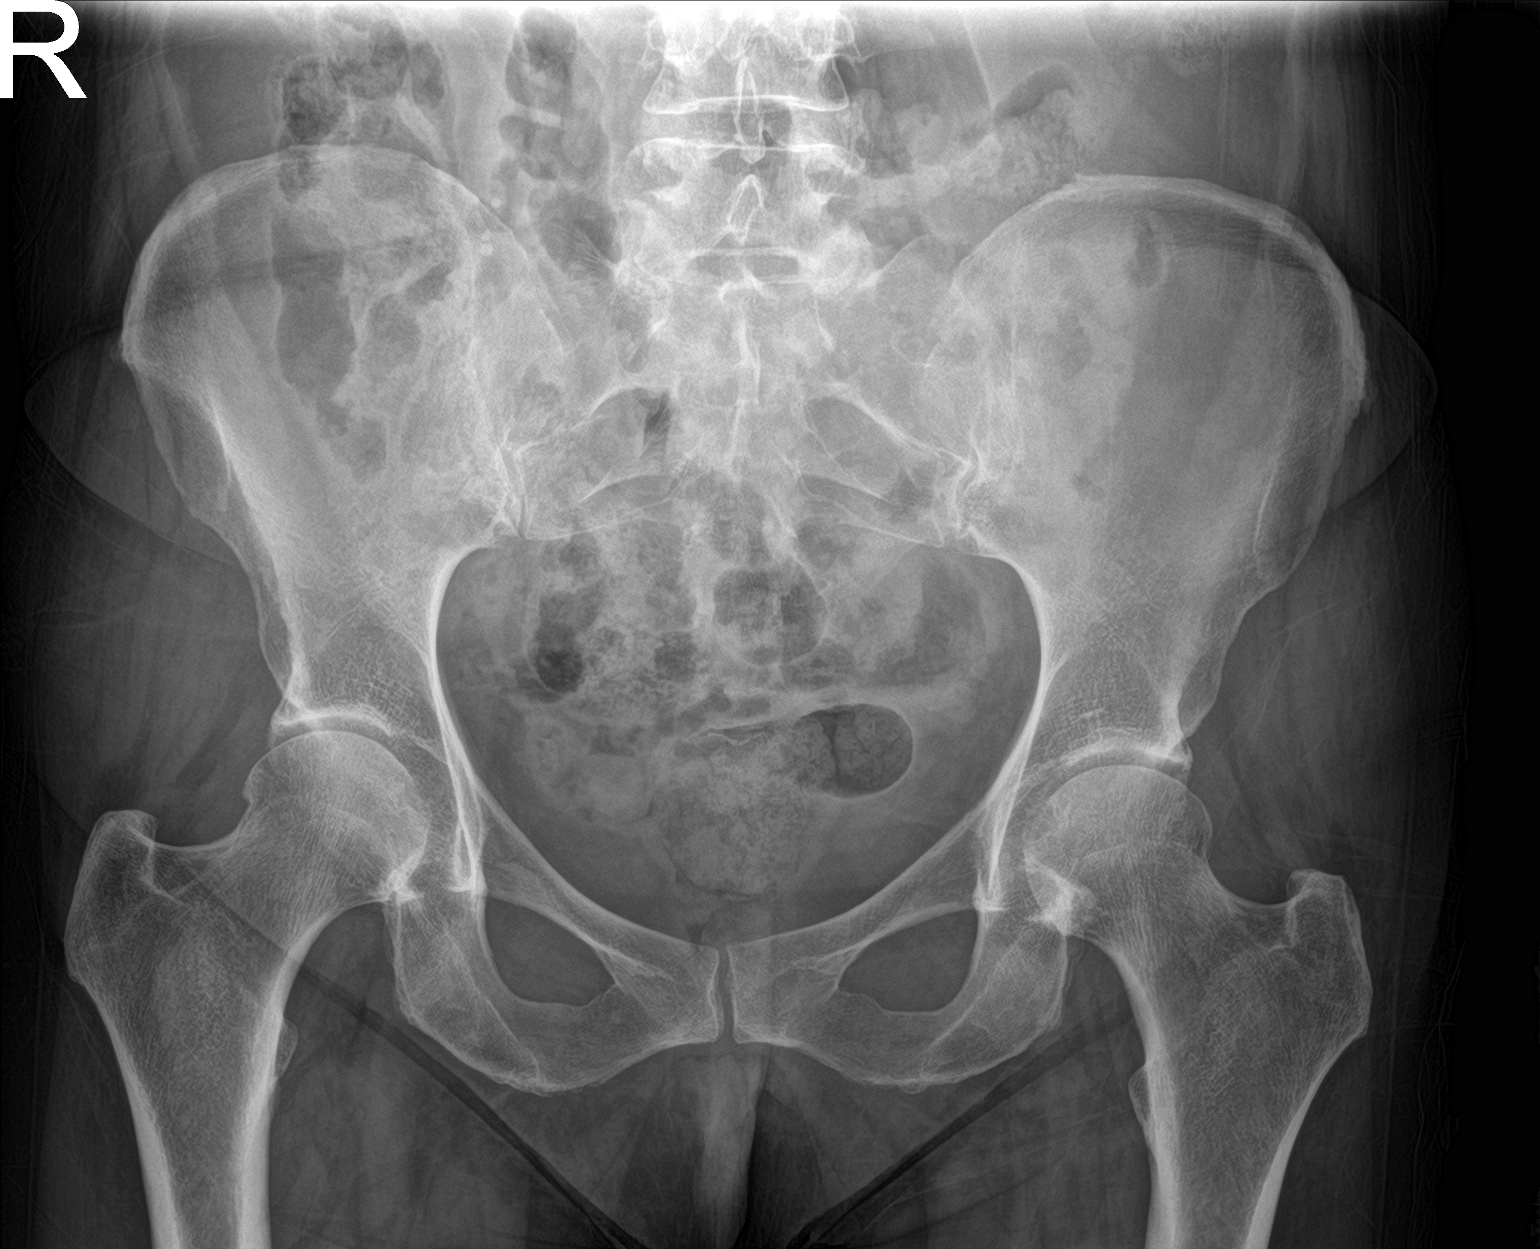
[im 2/3]
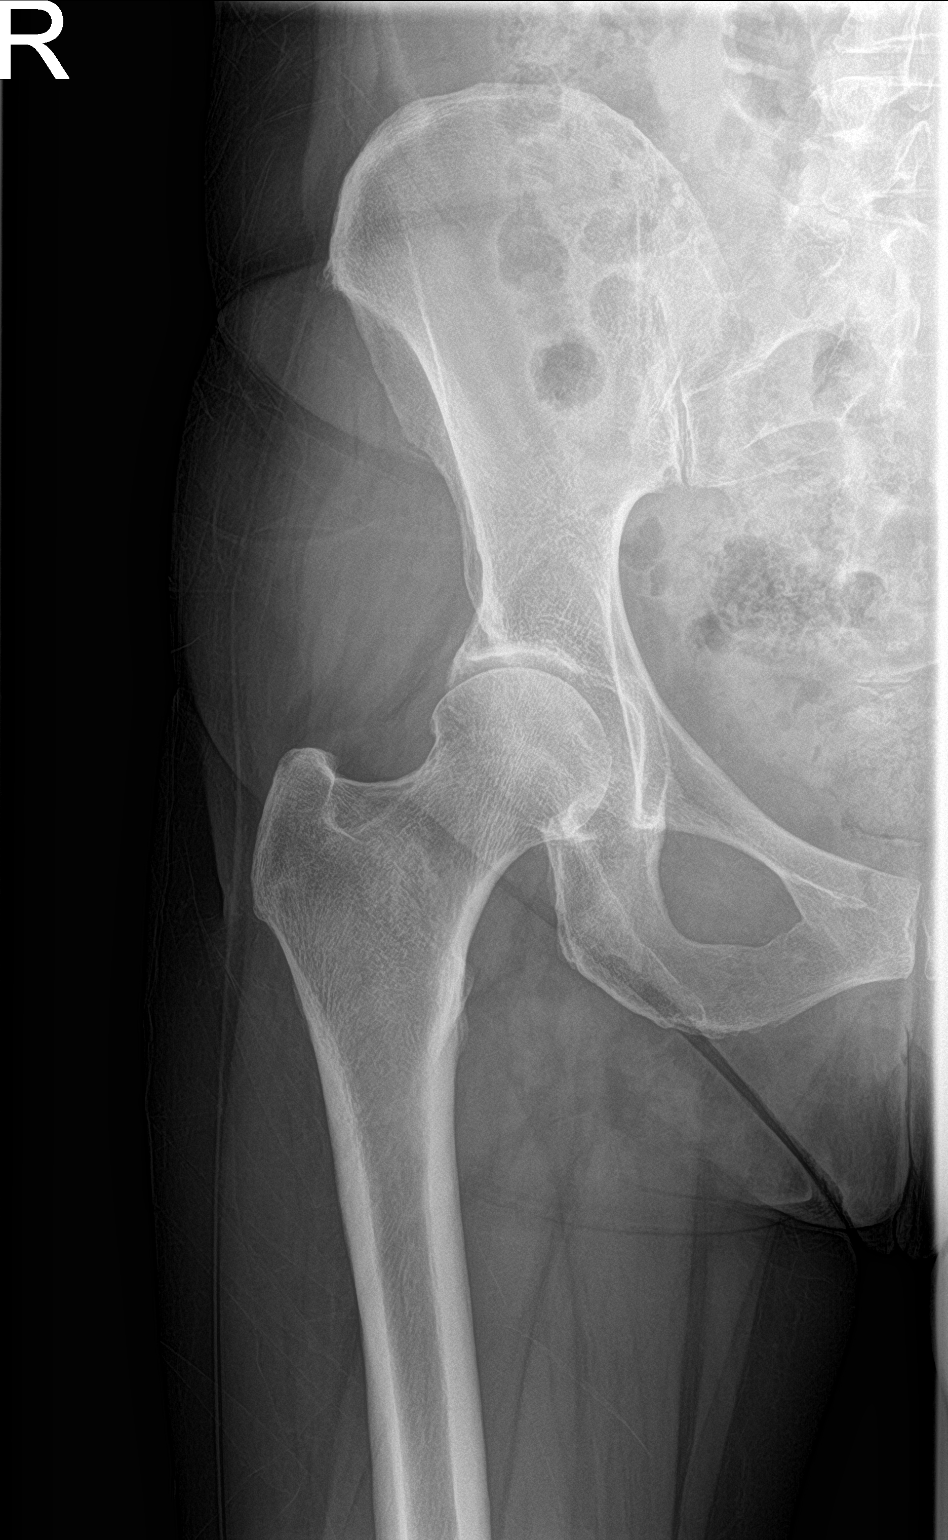
[im 3/3]
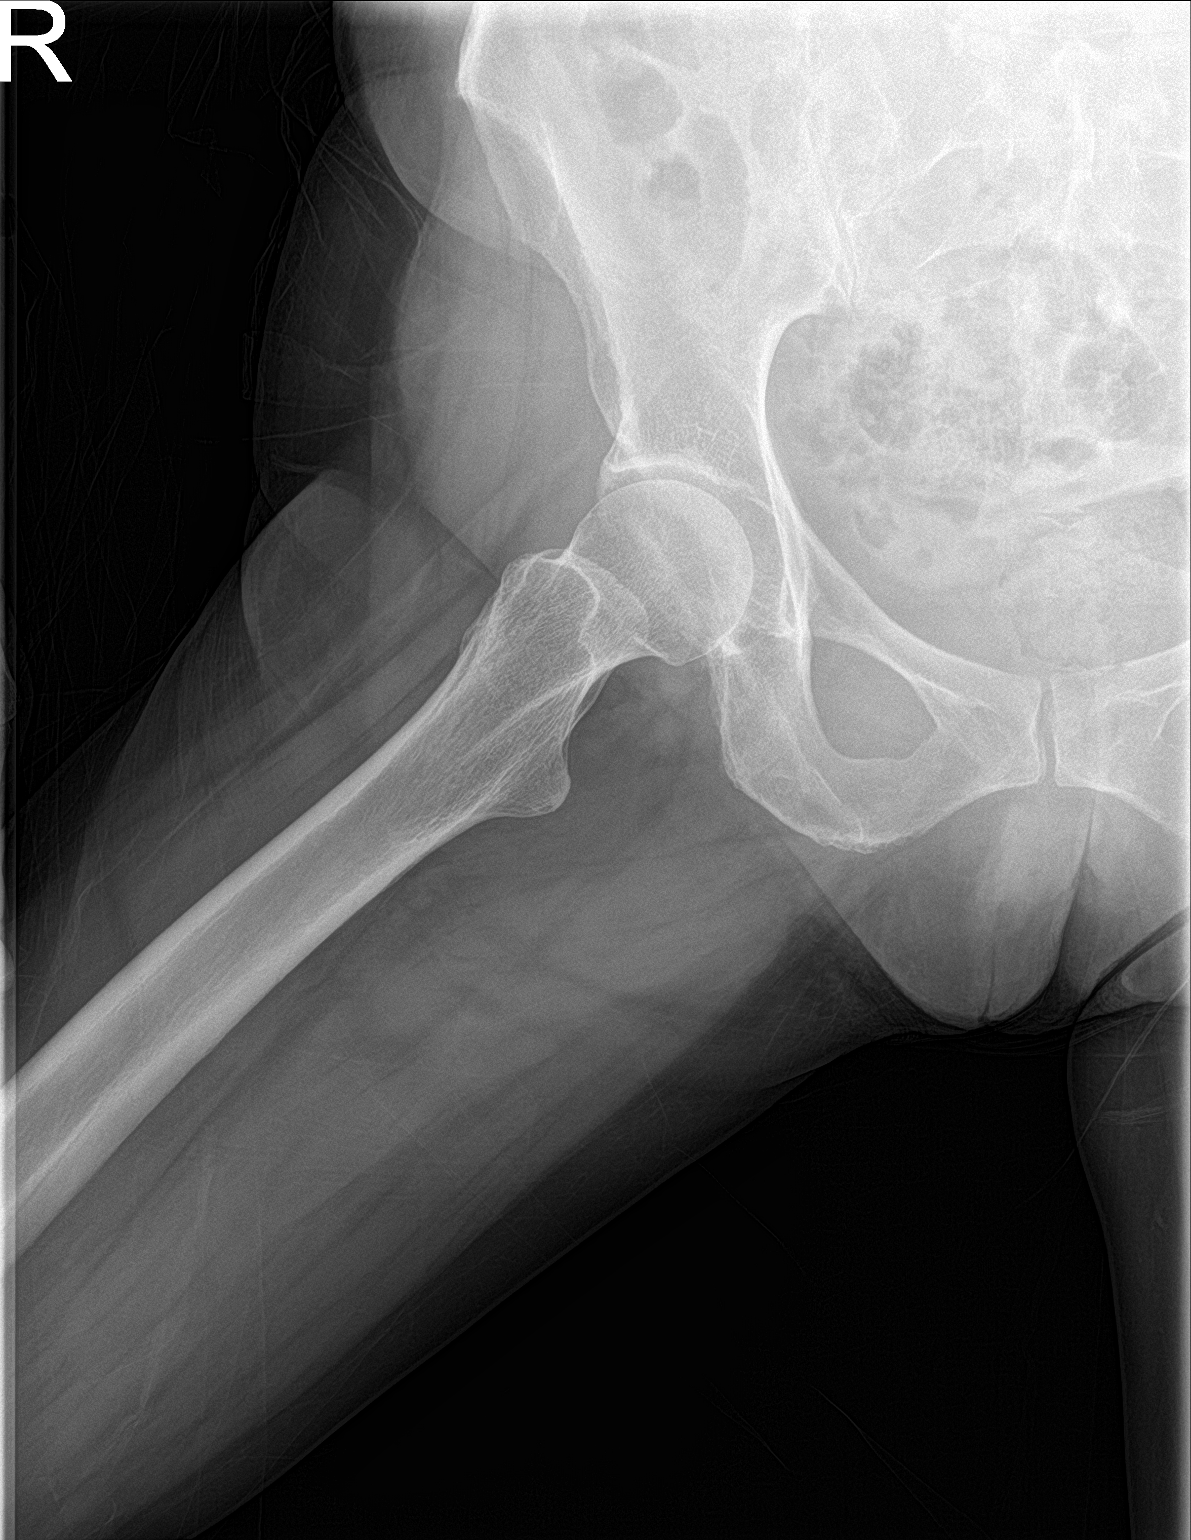

[3 of 3 positions shown; findings below may reference images not displayed]

FINDINGS: Femoral heads in normal alignment within the acetabula. No acute
fracture or dislocation. Bony pelvis intact. Femoral head height
maintained without flattening, sclerosis, or fragmentation to
suggest AVN. SI joints approximated symmetric. No acute soft tissue
abnormality.
IMPRESSION: 1. No acute osseous abnormality about the right hip.
2. No radiographic evidence for AVN.

## 2019-09-18 ENCOUNTER — Emergency Department

## 2019-09-18 ENCOUNTER — Other Ambulatory Visit: Payer: Self-pay

## 2019-09-18 ENCOUNTER — Encounter: Payer: Self-pay | Admitting: Emergency Medicine

## 2019-09-18 ENCOUNTER — Inpatient Hospital Stay
Admission: EM | Admit: 2019-09-18 | Discharge: 2019-09-20 | DRG: 149 | Disposition: A | Discharge status: Home / Self Care | Attending: Internal Medicine | Admitting: Internal Medicine

## 2019-09-18 ENCOUNTER — Observation Stay

## 2019-09-18 DIAGNOSIS — R29810 Facial weakness: Secondary | ICD-10-CM | POA: Diagnosis present

## 2019-09-18 DIAGNOSIS — E039 Hypothyroidism, unspecified: Secondary | ICD-10-CM | POA: Diagnosis not present

## 2019-09-18 DIAGNOSIS — F17290 Nicotine dependence, other tobacco product, uncomplicated: Secondary | ICD-10-CM | POA: Diagnosis not present

## 2019-09-18 DIAGNOSIS — F319 Bipolar disorder, unspecified: Secondary | ICD-10-CM | POA: Diagnosis not present

## 2019-09-18 DIAGNOSIS — H8113 Benign paroxysmal vertigo, bilateral: Secondary | ICD-10-CM | POA: Diagnosis present

## 2019-09-18 DIAGNOSIS — Z79890 Hormone replacement therapy (postmenopausal): Secondary | ICD-10-CM

## 2019-09-18 DIAGNOSIS — Z79899 Other long term (current) drug therapy: Secondary | ICD-10-CM | POA: Diagnosis not present

## 2019-09-18 DIAGNOSIS — I10 Essential (primary) hypertension: Secondary | ICD-10-CM | POA: Diagnosis not present

## 2019-09-18 DIAGNOSIS — Z7982 Long term (current) use of aspirin: Secondary | ICD-10-CM

## 2019-09-18 DIAGNOSIS — R42 Dizziness and giddiness: Secondary | ICD-10-CM | POA: Diagnosis not present

## 2019-09-18 DIAGNOSIS — R2990 Unspecified symptoms and signs involving the nervous system: Secondary | ICD-10-CM | POA: Diagnosis present

## 2019-09-18 DIAGNOSIS — H5500 Unspecified nystagmus: Secondary | ICD-10-CM | POA: Diagnosis not present

## 2019-09-18 DIAGNOSIS — Z20822 Contact with and (suspected) exposure to covid-19: Secondary | ICD-10-CM | POA: Diagnosis present

## 2019-09-18 DIAGNOSIS — E782 Mixed hyperlipidemia: Secondary | ICD-10-CM | POA: Diagnosis present

## 2019-09-18 LAB — COMPREHENSIVE METABOLIC PANEL
ALT: 16 U/L (ref 0–44)
AST: 18 U/L (ref 15–41)
Albumin: 4.5 g/dL (ref 3.5–5.0)
Alkaline Phosphatase: 60 U/L (ref 38–126)
Anion gap: 9 (ref 5–15)
BUN: 19 mg/dL (ref 8–23)
CO2: 22 mmol/L (ref 22–32)
Calcium: 10 mg/dL (ref 8.9–10.3)
Chloride: 103 mmol/L (ref 98–111)
Creatinine, Ser: 1.09 mg/dL — ABNORMAL HIGH (ref 0.44–1.00)
GFR calc Af Amer: >60 mL/min (ref >60)
GFR calc non Af Amer: 54 mL/min — ABNORMAL LOW (ref >60)
Glucose, Bld: 95 mg/dL (ref 70–99)
Potassium: 4 mmol/L (ref 3.5–5.1)
Sodium: 134 mmol/L — ABNORMAL LOW (ref 135–145)
Total Bilirubin: 0.7 mg/dL (ref 0.3–1.2)
Total Protein: 7.4 g/dL (ref 6.5–8.1)

## 2019-09-18 LAB — CBC
HCT: 35.3 % — ABNORMAL LOW (ref 36.0–46.0)
Hemoglobin: 12.3 g/dL (ref 12.0–15.0)
MCH: 31.9 pg (ref 26.0–34.0)
MCHC: 34.8 g/dL (ref 30.0–36.0)
MCV: 91.7 fL (ref 80.0–100.0)
Platelets: 266 10*3/uL (ref 150–400)
RBC: 3.85 MIL/uL — ABNORMAL LOW (ref 3.87–5.11)
RDW: 12.1 % (ref 11.5–15.5)
WBC: 7.1 10*3/uL (ref 4.0–10.5)
nRBC: 0 % (ref 0.0–0.2)

## 2019-09-18 LAB — DIFFERENTIAL
Abs Immature Granulocytes: 0.02 10*3/uL (ref 0.00–0.07)
Basophils Absolute: 0 10*3/uL (ref 0.0–0.1)
Basophils Relative: 1 %
Eosinophils Absolute: 0.2 10*3/uL (ref 0.0–0.5)
Eosinophils Relative: 3 %
Immature Granulocytes: 0 %
Lymphocytes Relative: 28 %
Lymphs Abs: 2 10*3/uL (ref 0.7–4.0)
Monocytes Absolute: 0.5 10*3/uL (ref 0.1–1.0)
Monocytes Relative: 7 %
Neutro Abs: 4.3 10*3/uL (ref 1.7–7.7)
Neutrophils Relative %: 61 %

## 2019-09-18 LAB — PROTIME-INR
INR: 0.9 (ref 0.8–1.2)
Prothrombin Time: 12.5 seconds (ref 11.4–15.2)

## 2019-09-18 LAB — LITHIUM LEVEL: Lithium Lvl: 0.37 mmol/L — ABNORMAL LOW (ref 0.60–1.20)

## 2019-09-18 LAB — GLUCOSE, CAPILLARY: Glucose-Capillary: 86 mg/dL (ref 70–99)

## 2019-09-18 LAB — APTT: aPTT: 25 seconds (ref 24–36)

## 2019-09-18 MED ORDER — MECLIZINE HCL 25 MG PO TABS
25.0000 mg | ORAL_TABLET | Freq: Once | ORAL | Status: AC
  Administered 2019-09-18: 25 mg via ORAL
  Filled 2019-09-18: qty 1

## 2019-09-18 MED ORDER — GADOBUTROL 1 MMOL/ML IV SOLN
6.0000 mL | Freq: Once | INTRAVENOUS | Status: AC | PRN
  Administered 2019-09-18: 6 mL via INTRAVENOUS

## 2019-09-18 MED ORDER — RISPERIDONE 1 MG PO TABS
1.0000 mg | ORAL_TABLET | Freq: Every day | ORAL | Status: DC
  Administered 2019-09-19 (×2): 1 mg via ORAL
  Filled 2019-09-18 (×4): qty 1

## 2019-09-18 MED ORDER — TEMAZEPAM 7.5 MG PO CAPS
7.5000 mg | ORAL_CAPSULE | Freq: Every day | ORAL | Status: DC
  Administered 2019-09-19: 7.5 mg via ORAL
  Filled 2019-09-18: qty 1

## 2019-09-18 MED ORDER — ASPIRIN 81 MG PO CHEW
324.0000 mg | CHEWABLE_TABLET | Freq: Once | ORAL | Status: AC
  Administered 2019-09-18: 324 mg via ORAL
  Filled 2019-09-18: qty 4

## 2019-09-18 MED ORDER — ASPIRIN EC 325 MG PO TBEC
325.0000 mg | DELAYED_RELEASE_TABLET | Freq: Every day | ORAL | Status: DC
  Administered 2019-09-19 – 2019-09-20 (×2): 325 mg via ORAL
  Filled 2019-09-18 (×2): qty 1

## 2019-09-18 MED ORDER — GADOBUTROL 1 MMOL/ML IV SOLN
6.0000 mL | Freq: Once | INTRAVENOUS | Status: DC | PRN
  Filled 2019-09-18: qty 6

## 2019-09-18 MED ORDER — SODIUM CHLORIDE 0.9% FLUSH: 3.0000 mL | Freq: Once | INTRAVENOUS | Status: DC

## 2019-09-18 MED ORDER — LITHIUM CARBONATE 300 MG PO CAPS
600.0000 mg | ORAL_CAPSULE | Freq: Every day | ORAL | Status: DC
  Filled 2019-09-18 (×2): qty 2

## 2019-09-18 MED ORDER — LEVOTHYROXINE SODIUM 112 MCG PO TABS
112.0000 ug | ORAL_TABLET | Freq: Every day | ORAL | Status: DC
  Administered 2019-09-19 – 2019-09-20 (×2): 112 ug via ORAL
  Filled 2019-09-18 (×3): qty 1

## 2019-09-18 NOTE — ED Notes (Signed)
CODE STROKE CALLED TO 333 

## 2019-09-18 NOTE — ED Notes (Signed)
MD giving ok to release pt's lithium for tonight.

## 2019-09-18 NOTE — Progress Notes (Signed)
CODE STROKE- PHARMACY COMMUNICATION   Time CODE STROKE called/page received: 1628  Time response to CODE STROKE was made (in person or via phone): 1640  Time Stroke Kit retrieved from [place] (only if needed): 1640  Name of Provider/Nurse contacted: Dr. Jacqualine Code; decision made not to administer tPA per pt, neurologist and Dr. Hillery Jacks ,PharmD Clinical Pharmacist  09/18/2019  5:31 PM

## 2019-09-18 NOTE — H&P (Signed)
History and Physical        Hospital Admission Note Date: 09/18/2019  Patient name: Brittany Guerra Medical record number: [id number] Date of birth: [birth year] Age: 64 y.o. Gender: female  PCP: Dion Body, MD    Patient coming from:  Home   I have reviewed all records in the [facility].    Chief Complaint:  Dizziness   HPI: Brittany Guerra is a 64 y.o. female with PMH of bipolar disorder, HTN, hypothyroidism who presents for sudden onset of dizziness at 1400 today. She reports seems like room is spinning. Has had difficulty ambulating due to dizziness. Fell into the couch today but was able to catch herself. Denies hitting head or LOC. Also endorses some vision changes where things seem "wavy".     ED work-up/course:    Patient presents her symptoms seem to be very concerning for possible stroke possible posterior nature however there is some uncertainty as well, patient reports she had some neurologic symptoms but a month ago, lithium had to be adjusted and when the medication she started resulted also with some service left-sided tremor or neurologic change.  She is overall well-appearing, she is appears to be having mild symptoms of a possible posterior stroke overall, however very extensive conversation with her risks benefits and also involving neurologist decision is made not to give TPA due to mild symptoms, potential that lithium could be related to some of her visual changes, and also the concern related to risk of receiving TPA given the mildness of her symptoms.  ----------------------------------------- 5:40 PM on 09/18/2019 -----------------------------------------  Aspirin and meclizine given as recommended by neurology, admit to the hospital for further work-up.  Awaiting lab work including lithium level  Review of Systems: Positives marked in 'bold' Constitutional: Denies fever,  chills, diaphoresis, poor appetite and fatigue.  HEENT: Denies photophobia, eye pain, redness, hearing loss, ear pain, congestion, sore throat, rhinorrhea, sneezing, mouth sores, trouble swallowing, neck pain, neck stiffness and tinnitus.   Respiratory: Denies SOB, DOE, cough, chest tightness,  and wheezing.   Cardiovascular: Denies chest pain, palpitations and leg swelling.  Gastrointestinal: Denies nausea, vomiting, abdominal pain, diarrhea, constipation, blood in stool and abdominal distention.  Genitourinary: Denies dysuria, urgency, frequency, hematuria, flank pain and difficulty urinating.  Musculoskeletal: Denies myalgias, back pain, joint swelling, arthralgias and gait problem.  Skin: Denies pallor, rash and wound.  Neurological: Denies dizziness, seizures, syncope, weakness, light-headedness, numbness and headaches.  Hematological: Denies adenopathy. Easy bruising, personal or family bleeding history  Psychiatric/Behavioral: Denies suicidal ideation, mood changes, confusion, nervousness, sleep disturbance and agitation  Past Medical History: Past Medical History:  Diagnosis Date  . Bipolar disorder ([place])   . Constipation    chronic  . Dysphagia   . GERD (gastroesophageal reflux disease)   . History of hiatal hernia   . Hypercholesteremia   . Hypertension   . Hypothyroidism   . Wears dentures    full upper and lower    Past Surgical History:  Procedure Laterality Date  . ABDOMINAL HYSTERECTOMY    . BREAST BIOPSY Right 04/2017   FOCAL NONCASEATING EPITHELIOID GRANULOMATOUS INFLAMMATION. NEGATIVE FOR MALIGNANCY  . BREAST EXCISIONAL BIOPSY Left  2014   benign  . BREAST SURGERY Left 2013   calcifications  . COLONOSCOPY  2008  . COLONOSCOPY N/A 01/29/2016   Procedure: COLONOSCOPY;  Surgeon: Hulen Luster, MD;  Location: [place];  Service: Gastroenterology;  Laterality: N/A;  . ESOPHAGOGASTRODUODENOSCOPY N/A 01/29/2016   Procedure: ESOPHAGOGASTRODUODENOSCOPY (EGD);   Surgeon: Hulen Luster, MD;  Location: [place];  Service: Gastroenterology;  Laterality: N/A;  . SHOULDER SURGERY Left     Medications: Prior to Admission medications   Medication Sig Start Date End Date Taking? Authorizing Provider  aspirin 81 MG tablet Take 81 mg by mouth daily.   Yes [provider]  buPROPion (WELLBUTRIN SR) 100 MG 12 hr tablet Take 100 mg by mouth 2 (two) times daily.   Yes [provider]  esomeprazole (NEXIUM) 20 MG capsule Take 20 mg by mouth daily. 06/30/19  Yes [provider]  levothyroxine (SYNTHROID) 88 MCG tablet Take 88 mcg by mouth daily. Take on an empty stomach with a glass of water at least 30-60 minutes before breakfast. 05/18/19  Yes [provider]  lithium 300 MG tablet Take 600 mg by mouth daily. Takes at bedtime   Yes [provider]  LORazepam (ATIVAN) 1 MG tablet Take 1 mg by mouth 2 (two) times daily as needed for anxiety. 07/26/18  Yes [provider]  metoprolol tartrate (LOPRESSOR) 25 MG tablet Take 25 mg by mouth 2 (two) times daily.   Yes [provider]  risperiDONE (RISPERDAL) 1 MG tablet Take 1 mg by mouth at bedtime.   Yes [provider]  sucralfate (CARAFATE) 1 g tablet Take 1 g by mouth 4 (four) times daily.   Yes [provider]  tolterodine (DETROL LA) 4 MG 24 hr capsule Take 4 mg by mouth daily.   Yes [provider]  albuterol (PROVENTIL HFA;VENTOLIN HFA) 108 (90 Base) MCG/ACT inhaler Inhale into the lungs every 6 (six) hours as needed for wheezing or shortness of breath.    [provider]    Allergies:   Allergies  Allergen Reactions  . Sulfa Antibiotics Hives    Social History:  reports that she has been smoking e-cigarettes. She quit smokeless tobacco use about 7 years ago. She reports current alcohol use of about 3.0 standard drinks of alcohol per week. No history on file for drug.  Family History: Family History    Problem Relation Age of Onset  . Breast cancer Maternal Grandmother 50    Physical Exam: Blood pressure (!) 152/85, pulse 81, resp. rate (!) 23, weight 63.7 kg, SpO2 99 %. General: Alert, awake, oriented x3, in no acute distress. Eyes: pink conjunctiva,anicteric sclera, pupils equal and reactive to light and accomodation, some horizontal nystagmus present  HEENT: normocephalic, atraumatic, oropharynx clear, slight left facial droop  Neck: supple, no masses or lymphadenopathy, no goiter, no bruits, no JVD CVS: Regular rate and rhythm, without murmurs, rubs or gallops. No lower extremity edema Resp : Clear to auscultation bilaterally, no wheezing, rales or rhonchi. GI : Soft, nontender, nondistended, positive bowel sounds, no masses. No hepatomegaly. No hernia.  Musculoskeletal: No clubbing or cyanosis, positive pedal pulses. No contracture. ROM intact  Neuro: Strength 5/5 in all extremities. Slowed finger to nose test but otherwise normal. Normal heel to shin test. Normal speech and language.  Psych: alert and oriented x 3, normal mood and affect Skin: no rashes or lesions, warm and dry   LABS on Admission: I have personally reviewed  all the labs and imagings below    Basic Metabolic Panel: Recent Labs  Lab 09/18/19 1648  NA 134*  K 4.0  CL 103  CO2 22  GLUCOSE 95  BUN 19  CREATININE 1.09*  CALCIUM 10.0   Liver Function Tests: Recent Labs  Lab 09/18/19 1648  AST 18  ALT 16  ALKPHOS 60  BILITOT 0.7  PROT 7.4  ALBUMIN 4.5   No results for input(s): LIPASE, AMYLASE in the last 168 hours. No results for input(s): AMMONIA in the last 168 hours. CBC: Recent Labs  Lab 09/18/19 1648  WBC 7.1  NEUTROABS 4.3  HGB 12.3  HCT 35.3*  MCV 91.7  PLT 266   Cardiac Enzymes: No results for input(s): CKTOTAL, CKMB, CKMBINDEX, TROPONINI in the last 168 hours. BNP: Invalid input(s): POCBNP CBG: Recent Labs  Lab 09/18/19 1623  GLUCAP 86    Radiological Exams on  Admission:  CT HEAD CODE STROKE WO CONTRAST  Result Date: 09/18/2019 CLINICAL DATA:  Code stroke. EXAM: CT HEAD WITHOUT CONTRAST TECHNIQUE: Contiguous axial images were obtained from the base of the skull through the vertex without intravenous contrast. COMPARISON:  None. FINDINGS: Brain: There is no acute intracranial hemorrhage, mass effect, or edema. Gray-white differentiation remains preserved. There are patchy and confluent areas of hypoattenuation in the supratentorial white matter, which are nonspecific but probably reflect moderate chronic microvascular ischemic changes. Ventricles and sulci are within normal limits in size and configuration. There is no extra-axial fluid collection. Vascular: There is no hyperdense vessel. Mild intracranial atherosclerotic calcification is present at the skull base. Skull: Calvarium is unremarkable. Sinuses/Orbits: Imaged paranasal sinuses are clear. Orbits are unremarkable. Other: Mastoid air cells are clear. There is rightward deviation of the nasal septum. ASPECTS [facility] Stroke Program Early CT Score) - Ganglionic level infarction (caudate, lentiform nuclei, internal capsule, insula, M1-M3 cortex): 7 - Supraganglionic infarction (M4-M6 cortex): 3 Total score (0-10 with 10 being normal): 10 IMPRESSION: 1. No acute intracranial hemorrhage or evidence of acute infarction. ASPECTS is 10. 2. Moderate chronic microvascular ischemic changes. These results were called by telephone at the time of interpretation on 09/18/2019 at 4:36 pm to provider Dr. Jacqualine Code, who verbally acknowledged these results. Electronically Signed   By: Macy Mis M.D.   On: 09/18/2019 16:41      EKG: Independently reviewed. NSR. No acute ST segment changes.    Assessment/Plan Active Problems:   Dizziness   Essential hypertension   Hyperlipidemia, mixed   Bipolar disorder (H[facility])   Acquired hypothyroidism  Dizziness, Suspected Posterior Stroke  Symptoms concerning for posterior stroke.  Lithium level actually slightly low so can not attribute presentation to Lithium toxicity. CT head without acute intracranial hemorrhage or infarct; chronic microvascular ischemic changes present. Patient seen by tele-neurology who recommends admission for stroke work up. Neurology discussed TPA with patient and patient declined.  -admit to observation, telemetry  -neurology consulted, appreciate recs  -obtain MRI brain and neck  -ordered echo  -neuro checks  -fall precautions  -NPO pending bedside swallow evaluation -PT/OT/SLP recs  -Aspirin 325 mg daily  -Meclizine 25 mg q6h  -obtain A1c and lipid panel for risk stratification   HTN  BPs mildly elevated at time of admission.  -hold Metoprolol 25 mg BID for now for permissive HTN   HLD  Patient reports she has never been on statin. Was previously taking ASA 81 mg until PCP discontinued.  -obtain lipid panel   Bipolar Disorder  Mood stable. Lithium level 0.37.  -  continue psych medications   Hypothyroidism  -continue Synthroid   DVT prophylaxis: Lovenox   CODE STATUS: FULL   Consults called: Neurology   Family Communication: Admission, patients condition and plan of care including tests being ordered have been discussed with the patient  who indicates understanding and agree with the plan and Code Status  Admission status: Observation   The medical decision making on this patient was of high complexity and the patient is at high risk for clinical deterioration, therefore this is a level 3 admission.  Severity of Illness:     Moderate  The appropriate patient status for this patient is OBSERVATION. Observation status is judged to be reasonable and necessary in order to provide the required intensity of service to ensure the patient's safety. The patient's presenting symptoms, physical exam findings, and initial radiographic and laboratory data in the context of their medical condition is felt to place them at decreased risk for  further clinical deterioration. Furthermore, it is anticipated that the patient will be medically stable for discharge from the hospital within 2 midnights of admission. The following factors support the patient status of observation.   " The patient's presenting symptoms include dizziness. " The physical exam findings include nystagmus, left facial droop. " The initial radiographic and laboratory data are CT head negative for acute changes, lithium level normal.     Time Spent on Admission: 42 minutes      Melina Schools D.O.  [facility] 09/18/2019, 8:58 PM

## 2019-09-18 NOTE — ED Notes (Addendum)
Decision to not give TPA made with neurologist, patient, and Dr. Jacqualine Code at bedside.

## 2019-09-18 NOTE — ED Triage Notes (Signed)
1625- cleared by dr Jacqualine Code for ct scan  1627-arrived to ct scan  1635- teleneuro RN on screen

## 2019-09-18 NOTE — ED Notes (Addendum)
1647- Neurologist at bedside virtually at this time.

## 2019-09-18 NOTE — ED Notes (Signed)
Patient transported to MRI 

## 2019-09-18 NOTE — ED Notes (Signed)
This RN introduced self to pt. Pt stating she is worried she will not get her lithium tonight and won't be able to sleep unless she gets it. MD made aware.

## 2019-09-18 NOTE — ED Provider Notes (Addendum)
[facility] Emergency Department Provider Note ____________________________________________   First MD Initiated Contact with Patient 09/18/19 1636     (approximate)  I have reviewed the triage vital signs and the nursing notes.  HISTORY  Chief Complaint Dizziness  EM caveat, acute neuro deficits  HPI Brittany Guerra is a 64 y.o. female history of bipolar disorder, hypertension hypothyroidism  2 PM today the patient had sudden onset of dizziness.  Reports she has had stuff like this happen in the past but only last for few seconds, but today she started feeling dizzy, got up felt like she was difficult getting her balance.  Little bit difficulty walking because of the dizziness.  She reports it feels like the room just continues to keep spinning  She does also reports that she had some difficulties with her lithium about a month ago, her just doctor adjusted her medication and she had developed a side effect where she felt numb or weak or something strange overall left side of her body this is got better   No fevers chills or recent illness.  Past Medical History:  Diagnosis Date  . Bipolar disorder ([place])   . Constipation    chronic  . Dysphagia   . GERD (gastroesophageal reflux disease)   . History of hiatal hernia   . Hypercholesteremia   . Hypertension   . Hypothyroidism   . Wears dentures    full upper and lower    There are no problems to display for this patient.   Past Surgical History:  Procedure Laterality Date  . ABDOMINAL HYSTERECTOMY    . BREAST BIOPSY Right 04/2017   FOCAL NONCASEATING EPITHELIOID GRANULOMATOUS INFLAMMATION. NEGATIVE FOR MALIGNANCY  . BREAST EXCISIONAL BIOPSY Left 2014   benign  . BREAST SURGERY Left 2013   calcifications  . COLONOSCOPY  2008  . COLONOSCOPY N/A 01/29/2016   Procedure: COLONOSCOPY;  Surgeon: Hulen Luster, MD;  Location: [place];  Service: Gastroenterology;  Laterality: N/A;  .  ESOPHAGOGASTRODUODENOSCOPY N/A 01/29/2016   Procedure: ESOPHAGOGASTRODUODENOSCOPY (EGD);  Surgeon: Hulen Luster, MD;  Location: [place];  Service: Gastroenterology;  Laterality: N/A;  . SHOULDER SURGERY Left     Prior to Admission medications   Medication Sig Start Date End Date Taking? Authorizing Provider  albuterol (PROVENTIL HFA;VENTOLIN HFA) 108 (90 Base) MCG/ACT inhaler Inhale into the lungs every 6 (six) hours as needed for wheezing or shortness of breath.    [provider]  aspirin 81 MG tablet Take 81 mg by mouth daily.    [provider]  buPROPion (WELLBUTRIN SR) 100 MG 12 hr tablet Take 100 mg by mouth daily.    [provider]  DOCUSATE CALCIUM PO Take by mouth daily.    [provider]  esomeprazole (NEXIUM) 40 MG capsule Take 40 mg by mouth daily at 12 noon.    [provider]  levothyroxine (SYNTHROID, LEVOTHROID) 112 MCG tablet Take 112 mcg by mouth daily before breakfast.    [provider]  lithium 300 MG tablet Take 600 mg by mouth daily. Takes at bedtime    [provider]  metoprolol tartrate (LOPRESSOR) 25 MG tablet Take 25 mg by mouth 2 (two) times daily.    [provider]  predniSONE (DELTASONE) 50 MG tablet Take one 50 mg tablet once daily for the next five days. 04/30/18   Lannie Fields, PA-C  risperiDONE (RISPERDAL) 1 MG tablet Take 1 mg by mouth at  bedtime.    [provider]  sucralfate (CARAFATE) 1 g tablet Take 1 g by mouth 4 (four) times daily.    [provider]  TEMAZEPAM PO Take by mouth daily.    [provider]  tolterodine (DETROL LA) 4 MG 24 hr capsule Take 4 mg by mouth daily.    [provider]    Allergies Sulfa antibiotics  Family History  Problem Relation Age of Onset  . Breast cancer Maternal Grandmother 73    Social History Social History   Tobacco Use  . Smoking status: Current Every Day Smoker    Types: E-cigarettes    . Smokeless tobacco: Former Systems developer    Quit date: 12/14/2011  . Tobacco comment: Prior to 2013 smoked 3 PPD for 39 yrs.  Now uses "Vapes"  Substance Use Topics  . Alcohol use: Yes    Alcohol/week: 3.0 standard drinks    Types: 3 Cans of beer per week  . Drug use: Not on file    Review of Systems Constitutional: No fever/chills Eyes: No visual changes except she feels like she is constantly spinning her eyes will hold still. ENT: No sore throat. Cardiovascular: Denies chest pain. Respiratory: Denies shortness of breath. Gastrointestinal: No abdominal pain.   Genitourinary: Negative for dysuria. Musculoskeletal: Negative for back pain. Skin: Negative for rash. Neurological: Negative for headaches, areas of focal weakness or numbness.    ____________________________________________   PHYSICAL EXAM:  VITAL SIGNS: ED Triage Vitals  Enc Vitals Group     BP 09/18/19 1621 (!) 149/86     Pulse Rate 09/18/19 1621 63     Resp 09/18/19 1621 16     Temp --      Temp src --      SpO2 09/18/19 1621 97 %     Weight 09/18/19 1635 140 lb 6.9 oz (63.7 kg)     Height --      Head Circumference --      Peak Flow --      Pain Score 09/18/19 1622 0     Pain Loc --      Pain Edu? --      Excl. in [place]? --     Constitutional: Alert and oriented. Well appearing and in no acute distress. Eyes: Conjunctivae are normal.  Some nonextinguishing nystagmus Head: Atraumatic. Nose: No congestion/rhinnorhea. Mouth/Throat: Mucous membranes are moist. Neck: No stridor.  Slight weakness or minimal facial droop on the left Cardiovascular: Normal rate, regular rhythm. Grossly normal heart sounds.  Good peripheral circulation. Respiratory: Normal respiratory effort.  No retractions. Lungs CTAB. Gastrointestinal: Soft and nontender. No distention. Musculoskeletal: No lower extremity tenderness nor edema. Neurologic:  Normal speech and language. No gross focal neurologic deficits are appreciated except as  noted, NIH exam score equals 3.  Some slight very mild expressive aphasia Skin:  Skin is warm, dry and intact. No rash noted. Psychiatric: Mood and affect are normal. Speech and behavior are normal.  ____________________________________________   LABS (all labs ordered are listed, but only abnormal results are displayed)  Labs Reviewed  SARS CORONAVIRUS 2 (TAT 6-24 HRS)  GLUCOSE, CAPILLARY  PROTIME-INR  APTT  CBC  DIFFERENTIAL  COMPREHENSIVE METABOLIC PANEL  LITHIUM LEVEL  CBG MONITORING, ED   ____________________________________________  EKG  Reviewed interpreted by me at 1705 Heart rate 70 Normal sinus rhythm, slight artifact, no evidence of acute ischemia or ectopy ____________________________________________  RADIOLOGY  CT HEAD CODE STROKE WO CONTRAST  Result Date: 09/18/2019 CLINICAL DATA:  Code stroke. EXAM: CT HEAD WITHOUT CONTRAST TECHNIQUE: Contiguous axial images were obtained from the base of the skull through the vertex without intravenous contrast. COMPARISON:  None. FINDINGS: Brain: There is no acute intracranial hemorrhage, mass effect, or edema. Gray-white differentiation remains preserved. There are patchy and confluent areas of hypoattenuation in the supratentorial white matter, which are nonspecific but probably reflect moderate chronic microvascular ischemic changes. Ventricles and sulci are within normal limits in size and configuration. There is no extra-axial fluid collection. Vascular: There is no hyperdense vessel. Mild intracranial atherosclerotic calcification is present at the skull base. Skull: Calvarium is unremarkable. Sinuses/Orbits: Imaged paranasal sinuses are clear. Orbits are unremarkable. Other: Mastoid air cells are clear. There is rightward deviation of the nasal septum. ASPECTS [facility] Stroke Program Early CT Score) - Ganglionic level infarction (caudate, lentiform nuclei, internal capsule, insula, M1-M3 cortex): 7 - Supraganglionic infarction  (M4-M6 cortex): 3 Total score (0-10 with 10 being normal): 10 IMPRESSION: 1. No acute intracranial hemorrhage or evidence of acute infarction. ASPECTS is 10. 2. Moderate chronic microvascular ischemic changes. These results were called by telephone at the time of interpretation on 09/18/2019 at 4:36 pm to provider Dr. Jacqualine Code, who verbally acknowledged these results. Electronically Signed   By: Macy Mis M.D.   On: 09/18/2019 16:41    Imaging discussed with radiologist ____________________________________________   PROCEDURES  Procedure(s) performed: None  Procedures  Critical Care performed: Yes, see critical care note(s)  CRITICAL CARE Performed by: Delman Kitten   Total critical care time: 40 minutes  Critical care time was exclusive of separately billable procedures and treating other patients.  Critical care was necessary to treat or prevent imminent or life-threatening deterioration.  Critical care was time spent personally by me on the following activities: development of treatment plan with patient and/or surrogate as well as nursing, discussions with consultants, evaluation of patient's response to treatment, examination of patient, obtaining history from patient or surrogate, ordering and performing treatments and interventions, ordering and review of laboratory studies, ordering and review of radiographic studies, pulse oximetry and re-evaluation of patient's condition.  As part of this time is extensive discussion with the patient, myself and Dr. Ellyn Hack regarding risks benefits of TPA, possible cause of her symptoms including a suspicion for stroke, but also discussion regarding lithium levels.  After extensive medical decision making, shared medical decision making with both myself, neurologist, and the patient involved decision is made to not use TPA due to concerns around the risks of TPA versus potential benefits, also some uncertainty as to if her lithium may be contributing to  some of her visual symptoms.  ____________________________________________   INITIAL IMPRESSION / ASSESSMENT AND PLAN / ED COURSE  Pertinent labs & imaging results that were available during my care of the patient were reviewed by me and considered in my medical decision making (see chart for details).    Clinical Course as of Sep 17 1733  Mon Sep 18, 2019  1649 NIH 3, performed by me about 15 minutes ago. Awaiting tele-neuro MD to complete consult.   [MQ]    Clinical Course User Index [MQ] Delman Kitten, MD   Patient presents her symptoms seem to be very concerning for possible stroke possible posterior nature however there is some uncertainty as well, patient reports she had some neurologic symptoms but a month ago, lithium had to be adjusted and when the medication she started resulted also with some service left-sided tremor or neurologic change.  She is overall well-appearing, she  is appears to be having mild symptoms of a possible posterior stroke overall, however very extensive conversation with her risks benefits and also involving neurologist decision is made not to give TPA due to mild symptoms, potential that lithium could be related to some of her visual changes, and also the concern related to risk of receiving TPA given the mildness of her symptoms.  ----------------------------------------- 5:40 PM on 09/18/2019 -----------------------------------------  Aspirin and meclizine given as recommended by neurology, admit to the hospital for further work-up.  Awaiting lab work including lithium level  ____________________________________________   FINAL CLINICAL IMPRESSION(S) / ED DIAGNOSES  Final diagnoses:  Nystagmus  Rule out stroke      Note:  This document was prepared using Dragon voice recognition software and may include unintentional dictation errors       Delman Kitten, MD 09/18/19 1741    Delman Kitten, MD 09/18/19 2126

## 2019-09-18 NOTE — ED Notes (Signed)
Pt up to toilet. Pt ambulatory with steady gait.

## 2019-09-18 NOTE — ED Triage Notes (Addendum)
Pt here for acute dizziness starting at 200 pm.  Feels off balance to the left when walking. Appears to have mild face droop to left. pt also nauseated.  Reports face looks a little different to her.

## 2019-09-18 NOTE — Consult Note (Signed)
[facility] TeleNeurology Consult Services   Date of Service:   09/18/2019 16:43:15  Impression:     .  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  R42 - Dizziness/ Vertigo/ Giddiness  Comments/Sign-Out: I discussed the case in detail with the patient. Also discussed the case with the ED attending. We spent significant time discussing the differential diagnosis, management of stroke with alteplase. Patient had multiple questions and she called some family members and was extremely concerned about risk of bleeding. She was also concerned about lithium side effects. We talked about risk of bleeding. We talked about long-term benefits of alteplase. After spending significant time with her and going over all the questions and answers, patient was reluctant and we decided not to proceed with IV alteplase. We are going to get a lithium level also to rule out lithium toxicity as cause of this. She will need to be admitted for MRI, MRA of the head and neck, echocardiogram, will start her on aspirin. She will get PT, OT eval's and neurology eval and ENT eval if the work-up is negative. I spent approximately 50 minutes on this consultation and more than 25 minutes with just spent on counseling with the patient and discussing alteplase.  Metrics: Last Known Well: 09/18/2019 14:00:00 [facility] Notification Time: 09/18/2019 16:43:15 Arrival Time: 09/18/2019 15:46:00 Stamp Time: 09/18/2019 16:43:15 Time First Login Attempt: 09/18/2019 16:46:09 Symptoms: Dizziness NIHSS Start Assessment Time: 09/18/2019 16:48:49 Patient is not a candidate for Alteplase/Activase. Patient was not deemed candidate for Alteplase/Activase thrombolytics because of Patient refused.  CT head showed no acute hemorrhage or acute core infarct.  Clinical Presentation is not Suggestive of Large Vessel Occlusive Disease  ED Physician notified of diagnostic impression and management plan on 09/18/2019  17:25:43  Our recommendations are outlined below.  Recommendations:     .  Activate Stroke Protocol Admission/Order Set     .  Stroke/Telemetry Floor     .  Neuro Checks     .  Bedside Swallow Eval     .  DVT Prophylaxis     .  IV Fluids, Normal Saline     .  Head of Bed 30 Degrees     .  Euglycemia and Avoid Hyperthermia (PRN Acetaminophen)     .  Initiate Aspirin 325 MG Daily     .  Meclizine 25 every 6hr  Routine Consultation with [facility] for Follow up Care  Sign Out:     .  Discussed with Emergency Department Provider    ------------------------------------------------------------------------------  History of Present Illness: Patient is a 64 year old Female.  Patient was brought by private transportation with symptoms of Dizziness  64 year old female with past medical history of hypertension, hyperlipidemia presented with dizziness. She reports around 2:00 she started having some dizziness with nausea. Future swallowing issues. Denies any visual problems. Denies any She came to the hospital because of the symptoms. She denies any focal numbness or tingling. Denies any headaches.    Past Medical History:     . Hypertension     . Hyperlipidemia     . There is NO history of Diabetes Mellitus     . There is NO history of Coronary Artery Disease     . There is NO history of Stroke    Antiplatelet use: No     Examination: BP(149/86), Pulse(63), Blood Glucose(86) 1A: Level of Consciousness - Alert; keenly responsive + 0 1B: Ask Month and Age - Both Questions  Right + 0 1C: Blink Eyes & Squeeze Hands - Performs Both Tasks + 0 2: Test Horizontal Extraocular Movements - Normal + 0 3: Test Visual Fields - No Visual Loss + 0 4: Test Facial Palsy (Use Grimace if Obtunded) - Minor paralysis (flat nasolabial fold, smile asymmetry) + 1 5A: Test Left Arm Motor Drift - Drift, but doesn't hit bed + 1 5B: Test Right Arm Motor Drift - No Drift for 10 Seconds + 0 6A:  Test Left Leg Motor Drift - No Drift for 5 Seconds + 0 6B: Test Right Leg Motor Drift - No Drift for 5 Seconds + 0 7: Test Limb Ataxia (FNF/Heel-Shin) - No Ataxia + 0 8: Test Sensation - Normal; No sensory loss + 0 9: Test Language/Aphasia - Normal; No aphasia + 0 10: Test Dysarthria - Normal + 0 11: Test Extinction/Inattention - No abnormality + 0  NIHSS Score: 2  Pre-Morbid Modified Ranking Scale: 0 Points = No symptoms at all  Patient/Family was informed the Neurology Consult would happen via TeleHealth consult by way of interactive audio and video telecommunications and consented to receiving care in this manner.   Due to the immediate potential for life-threatening deterioration due to underlying acute neurologic illness, I spent 50 minutes providing critical care. This time includes time for face to face visit via telemedicine, review of medical records, imaging studies and discussion of findings with providers, the patient and/or family.   Dr Faustino Congress   [facility] [phone]  Case [id number]

## 2019-09-19 ENCOUNTER — Other Ambulatory Visit: Payer: Self-pay

## 2019-09-19 ENCOUNTER — Inpatient Hospital Stay

## 2019-09-19 ENCOUNTER — Observation Stay
Admit: 2019-09-19 | Discharge: 2019-09-19 | Disposition: A | Discharge status: Home / Self Care | Attending: Internal Medicine | Admitting: Internal Medicine

## 2019-09-19 DIAGNOSIS — Z79899 Other long term (current) drug therapy: Secondary | ICD-10-CM | POA: Diagnosis not present

## 2019-09-19 DIAGNOSIS — E782 Mixed hyperlipidemia: Secondary | ICD-10-CM | POA: Diagnosis present

## 2019-09-19 DIAGNOSIS — R42 Dizziness and giddiness: Secondary | ICD-10-CM | POA: Diagnosis not present

## 2019-09-19 DIAGNOSIS — R2990 Unspecified symptoms and signs involving the nervous system: Secondary | ICD-10-CM | POA: Diagnosis present

## 2019-09-19 DIAGNOSIS — F17290 Nicotine dependence, other tobacco product, uncomplicated: Secondary | ICD-10-CM | POA: Diagnosis present

## 2019-09-19 DIAGNOSIS — E039 Hypothyroidism, unspecified: Secondary | ICD-10-CM | POA: Diagnosis present

## 2019-09-19 DIAGNOSIS — H5500 Unspecified nystagmus: Secondary | ICD-10-CM | POA: Diagnosis not present

## 2019-09-19 DIAGNOSIS — Z79890 Hormone replacement therapy (postmenopausal): Secondary | ICD-10-CM | POA: Diagnosis not present

## 2019-09-19 DIAGNOSIS — H8113 Benign paroxysmal vertigo, bilateral: Secondary | ICD-10-CM | POA: Diagnosis present

## 2019-09-19 DIAGNOSIS — I10 Essential (primary) hypertension: Secondary | ICD-10-CM | POA: Diagnosis present

## 2019-09-19 DIAGNOSIS — R29810 Facial weakness: Secondary | ICD-10-CM | POA: Diagnosis present

## 2019-09-19 DIAGNOSIS — F319 Bipolar disorder, unspecified: Secondary | ICD-10-CM | POA: Diagnosis not present

## 2019-09-19 DIAGNOSIS — Z7982 Long term (current) use of aspirin: Secondary | ICD-10-CM | POA: Diagnosis not present

## 2019-09-19 DIAGNOSIS — Z20822 Contact with and (suspected) exposure to covid-19: Secondary | ICD-10-CM | POA: Diagnosis present

## 2019-09-19 LAB — ECHOCARDIOGRAM COMPLETE: Weight: 2246.93 oz

## 2019-09-19 LAB — LIPID PANEL
Cholesterol: 280 mg/dL — ABNORMAL HIGH (ref 0–200)
HDL: 76 mg/dL (ref >40)
LDL Cholesterol: 168 mg/dL — ABNORMAL HIGH (ref 0–99)
Total CHOL/HDL Ratio: 3.7 RATIO
Triglycerides: 181 mg/dL — ABNORMAL HIGH (ref <150)
VLDL: 36 mg/dL (ref 0–40)

## 2019-09-19 LAB — HEMOGLOBIN A1C
Hgb A1c MFr Bld: 4.8 % (ref 4.8–5.6)
Mean Plasma Glucose: 91.06 mg/dL

## 2019-09-19 LAB — HIV ANTIBODY (ROUTINE TESTING W REFLEX): HIV Screen 4th Generation wRfx: NONREACTIVE

## 2019-09-19 LAB — SARS CORONAVIRUS 2 (TAT 6-24 HRS): SARS Coronavirus 2: NEGATIVE

## 2019-09-19 MED ORDER — MECLIZINE HCL 25 MG PO TABS
25.0000 mg | ORAL_TABLET | Freq: Four times a day (QID) | ORAL | Status: DC
  Administered 2019-09-19 – 2019-09-20 (×5): 25 mg via ORAL
  Filled 2019-09-19 (×8): qty 1

## 2019-09-19 MED ORDER — ENOXAPARIN SODIUM 40 MG/0.4ML ~~LOC~~ SOLN
40.0000 mg | SUBCUTANEOUS | Status: DC
  Administered 2019-09-19: 40 mg via SUBCUTANEOUS
  Filled 2019-09-19 (×2): qty 0.4

## 2019-09-19 MED ORDER — BUPROPION HCL ER (SR) 100 MG PO TB12
100.0000 mg | ORAL_TABLET | Freq: Every day | ORAL | Status: DC
  Administered 2019-09-19 – 2019-09-20 (×2): 100 mg via ORAL
  Filled 2019-09-19 (×2): qty 1

## 2019-09-19 MED ORDER — STROKE: EARLY STAGES OF RECOVERY BOOK: Freq: Once | Status: AC

## 2019-09-19 MED ORDER — ACETAMINOPHEN 650 MG RE SUPP: 650.0000 mg | RECTAL | Status: DC | PRN

## 2019-09-19 MED ORDER — TEMAZEPAM 15 MG PO CAPS
15.0000 mg | ORAL_CAPSULE | Freq: Once | ORAL | Status: AC
  Administered 2019-09-19: 15 mg via ORAL
  Filled 2019-09-19: qty 1

## 2019-09-19 MED ORDER — ACETAMINOPHEN 325 MG PO TABS: 650.0000 mg | ORAL_TABLET | ORAL | Status: DC | PRN

## 2019-09-19 MED ORDER — ACETAMINOPHEN 160 MG/5ML PO SOLN
650.0000 mg | ORAL | Status: DC | PRN
  Filled 2019-09-19: qty 20.3

## 2019-09-19 MED ORDER — LITHIUM CARBONATE 300 MG PO CAPS
600.0000 mg | ORAL_CAPSULE | Freq: Every day | ORAL | Status: DC
  Administered 2019-09-19 (×2): 300 mg via ORAL
  Filled 2019-09-19 (×3): qty 2

## 2019-09-19 MED ORDER — SODIUM CHLORIDE 0.9 % IV SOLN: INTRAVENOUS | Status: DC

## 2019-09-19 NOTE — Progress Notes (Signed)
OT Cancellation Note  Patient Details Name: Brittany Guerra MRN: [id number] DOB: 01-25-1956   Cancelled Treatment:    Reason Eval/Treat Not Completed: OT screened, no needs identified, will sign off  OT consult received and chart reviewed. Upon speaking with physical therapist regarding pt's performance, no acute OT needs identified. Will sign off. Thank you.   Gerrianne Scale, Ethel, OTR/L as[facility] [phone] 09/19/19, 1:13 PM

## 2019-09-19 NOTE — Progress Notes (Signed)
SLP Cancellation Note  Patient Details Name: Brittany Guerra MRN: [id number] DOB: 1955-10-28   Cancelled treatment:       Reason Eval/Treat Not Completed: SLP screened, no needs identified, will sign off(chart reviewed; consulted NSG then met w/ pt in room).Pt denied any difficulty swallowing and is currently on a regular diet; tolerates swallowing pills w/ water per NSG. Pt conversed at conversational level w/out deficits noted; pt and NSG denied any speech-language deficits. Pt does present w/ slight Left Labial deviation in corner of mouth -- pt stated that ~2 months ago when her MD change her medications(from Lithium to another med), she experienced Left sided "shaking of leg and overall weakness" and others noted the change in her mouth at that time; "it was puffy". She has since returned to her Lithium medication. This labial decreased tone does not impact intelligibility of speech nor bolus control during oral intake.   No further skilled ST services indicated as pt appears at her baseline. Pt agreed. NSG to reconsult if any change in status.     Orinda Kenner, MS, CCC-SLP Rohen Kimes 09/19/2019, 11:48 AM

## 2019-09-19 NOTE — ED Notes (Signed)
Pt assisted to bathroom

## 2019-09-19 NOTE — Progress Notes (Signed)
*  PRELIMINARY RESULTS* Echocardiogram 2D Echocardiogram has been performed.  Brittany Guerra Brittany Guerra 09/19/2019, 10:38 AM

## 2019-09-19 NOTE — Evaluation (Signed)
Physical Therapy Evaluation Patient Details Name: Brittany Guerra MRN: [id number] DOB: 1955-11-06 Today's Date: 09/19/2019   History of Present Illness  Patient is a 64 year old female admitted with dizziness. PMH includes: bipolar d/o, HTN, hypertension, hypothyroidism.  Clinical Impression  Patient received on stretcher in ED, agrees to PT assessment. Reports no dizziness at this time. She performed all basic mobility skills independently, ambulated 200 feet, no AD with supervision. Patient demonstrates no needs for PT at this time. Will sign off.        Follow Up Recommendations No PT follow up    Equipment Recommendations  None recommended by PT    Recommendations for Other Services       Precautions / Restrictions Precautions Precaution Comments: low fall risk Restrictions Weight Bearing Restrictions: No      Mobility  Bed Mobility Overal bed mobility: Independent                Transfers Overall transfer level: Independent Equipment used: None                Ambulation/Gait Ambulation/Gait assistance: Supervision Gait Distance (Feet): 200 Feet Assistive device: None Gait Pattern/deviations: WFL(Within Functional Limits) Gait velocity: WNL   General Gait Details: no lob, difficulty with ambulation  Stairs            Wheelchair Mobility    Modified Rankin (Stroke Patients Only)       Balance Overall balance assessment: Independent                                           Pertinent Vitals/Pain Pain Assessment: No/denies pain    Home Living Family/patient expects to be discharged to:: Private residence Living Arrangements: Alone   Type of Home: Apartment Home Access: Stairs to enter   [employer] of Steps: flight, lives on second floor of apartment complex Home Layout: One level Home Equipment: None      Prior Function Level of Independence: Independent               Hand Dominance       Extremity/Trunk Assessment   Upper Extremity Assessment Upper Extremity Assessment: Overall WFL for tasks assessed    Lower Extremity Assessment Lower Extremity Assessment: Overall WFL for tasks assessed    Cervical / Trunk Assessment Cervical / Trunk Assessment: Normal  Communication   Communication: No difficulties  Cognition Arousal/Alertness: Awake/alert Behavior During Therapy: WFL for tasks assessed/performed Overall Cognitive Status: Within Functional Limits for tasks assessed                                        General Comments      Exercises     Assessment/Plan    PT Assessment Patent does not need any further PT services  PT Problem List Decreased mobility       PT Treatment Interventions      PT Goals (Current goals can be found in the Care Plan section)  Acute Rehab PT Goals Patient Stated Goal: to hopefully return home tomorrow PT Goal Formulation: With patient Time For Goal Achievement: 09/26/19 Potential to Achieve Goals: Good    Frequency     Barriers to discharge Decreased caregiver support      Co-evaluation  AM-PAC PT "6 Clicks" Mobility  Outcome Measure Help needed turning from your back to your side while in a flat bed without using bedrails?: None Help needed moving from lying on your back to sitting on the side of a flat bed without using bedrails?: None Help needed moving to and from a bed to a chair (including a wheelchair)?: None Help needed standing up from a chair using your arms (e.g., wheelchair or bedside chair)?: None Help needed to walk in hospital room?: None Help needed climbing 3-5 steps with a railing? : None 6 Click Score: 24    End of Session   Activity Tolerance: Patient tolerated treatment well Patient left: in bed;with call bell/phone within reach Nurse Communication: Mobility status PT Visit Diagnosis: Dizziness and giddiness (R42)    Time: [id number] PT Time  Calculation (min) (ACUTE ONLY): 15 min   Charges:   PT Evaluation $PT Eval Low Complexity: 1 Low PT Treatments $Gait Training: 8-22 mins        Brittany Guerra, PT, GCS 09/19/19,12:50 PM

## 2019-09-19 NOTE — Progress Notes (Signed)
[facility] Progress Note  Patient: Brittany Guerra [id number]   PCP: Dion Body, MD DOB: [birth year]   DOA: 09/18/2019   DOS: 09/19/2019   Date of Service: the patient was seen and examined on 09/19/2019  Chief Complaint  Patient presents with  . Dizziness   Brief hospital course: Pt. with PMH of bipolar disorder, HTN, hypothyroidism; presented with complain of dizziness and vertigo, was found to have possible strokelike symptoms.  Currently further plan is further work-up.  Subjective: Continues to have dizziness and lightheadedness.  No nausea no vomiting.  No focal deficit otherwise.  Assessment and Plan: Scheduled Meds: . aspirin EC  325 mg Oral Daily  . buPROPion  100 mg Oral Daily  . enoxaparin (LOVENOX) injection  40 mg Subcutaneous Q24H  . levothyroxine  112 mcg Oral QAC breakfast  . lithium carbonate  600 mg Oral QHS  . meclizine  25 mg Oral Q6H  . risperiDONE  1 mg Oral QHS  . temazepam  7.5 mg Oral QHS   Continuous Infusions: . sodium chloride 100 mL/hr at 09/19/19 0925   PRN Meds: acetaminophen **OR** acetaminophen (TYLENOL) oral liquid 160 mg/5 mL **OR** acetaminophen  Dizziness, Suspected Posterior Stroke  Persistent dizziness. No nystagmus at the time of my evaluation. No other focal deficit. CT scan unremarkable. MRA unremarkable. Unfortunately patient underwent MRI with contrast and therefore unable to perform MRI brain which was actually ordered but not released. We will order MRI brain and monitor the results of the scan. Further evaluation and neuro consult pending the results of the MRI. Echocardiogram currently ordered pending. PT OT ordered currently pending. Speech therapy recommends no further intervention. -Aspirin 325 mg daily  -Meclizine 25 mg q6h   HTN  BPs mildly elevated at time of admission.  -hold Metoprolol 25 mg BID for now for permissive HTN   HLD  Patient reports she has never been on statin. Was previously  taking ASA 81 mg until PCP discontinued.  -obtain lipid panel   Bipolar Disorder  Mood stable. Lithium level 0.37.  -continue psych medications  -Patient thinks that 2 months ago when her psychiatric MD change her medication she started having symptoms which she thinks is contributing to her current dizziness as well.  Hypothyroidism  -continue Synthroid   Diet: Cardiac diet  DVT Prophylaxis: Subcutaneous Lovenox   Advance goals of care discussion: Full code  Family Communication: no family was present at bedside, at the time of interview.  Disposition:  Discharge to home.  Consultants: none Procedures: Echocardiogram  Antibiotics: Anti-infectives (From admission, onward)   None       Objective: Physical Exam: Vitals:   09/19/19 1422 09/19/19 1500 09/19/19 1629 09/19/19 1647  BP: 134/71  (!) 161/94   Pulse: 76  80   Resp: 15  17   Temp:   98.4 F (36.9 C)   TempSrc:   Oral   SpO2: 100%     Weight:    63.6 kg  Height:  5' 6.5" (1.689 m)  5\' 6"  (1.676 m)   No intake or output data in the 24 hours ending 09/19/19 1857 Filed Weights   09/18/19 1635 09/19/19 1647  Weight: 63.7 kg 63.6 kg   General: alert and oriented to time, place, and person. Appear in mild distress, affect appropriate Eyes: PERRL, Conjunctiva normal ENT: Oral Mucosa Clear, moist  Neck: no JVD, no Abnormal Mass Or lumps Cardiovascular: S1 and S2 Present, no Murmur,  Respiratory: good respiratory effort, Bilateral Air entry  equal and Decreased, no signs of accessory muscle use, Clear to Auscultation, no Crackles, no wheezes Abdomen: Bowel Sound present, Soft and no tenderness, no hernia Skin: no rashes  Extremities: no Pedal edema, no calf tenderness Neurologic: without any new focal findings  Gait not checked due to patient safety concerns  Data Reviewed: I have personally reviewed and interpreted daily labs, tele strips, imagings as discussed above. I reviewed all nursing notes, pharmacy  notes, vitals, pertinent old records I have discussed plan of care as described above with RN and patient/family.  CBC: Recent Labs  Lab 09/18/19 1648  WBC 7.1  NEUTROABS 4.3  HGB 12.3  HCT 35.3*  MCV 91.7  PLT 123456   Basic Metabolic Panel: Recent Labs  Lab 09/18/19 1648  NA 134*  K 4.0  CL 103  CO2 22  GLUCOSE 95  BUN 19  CREATININE 1.09*  CALCIUM 10.0    Liver Function Tests: Recent Labs  Lab 09/18/19 1648  AST 18  ALT 16  ALKPHOS 60  BILITOT 0.7  PROT 7.4  ALBUMIN 4.5   No results for input(s): LIPASE, AMYLASE in the last 168 hours. No results for input(s): AMMONIA in the last 168 hours. Coagulation Profile: Recent Labs  Lab 09/18/19 1648  INR 0.9   Cardiac Enzymes: No results for input(s): CKTOTAL, CKMB, CKMBINDEX, TROPONINI in the last 168 hours. BNP (last 3 results) No results for input(s): PROBNP in the last 8760 hours. CBG: Recent Labs  Lab 09/18/19 1623  GLUCAP 86   Studies: MR ANGIO NECK W WO CONTRAST  Result Date: 09/18/2019 CLINICAL DATA:  Vertigo EXAM: MRA NECK WITHOUT AND WITH CONTRAST TECHNIQUE: Multiplanar and multiecho pulse sequences of the neck were obtained without and with intravenous contrast. Angiographic images of the neck were obtained using MRA technique without and with intravenous contrast. CONTRAST:  36mL GADAVIST GADOBUTROL 1 MMOL/ML IV SOLN COMPARISON:  None. FINDINGS: Normal appearance of the aorta and arch vessels. No carotid stenosis, occlusion or other abnormality. Both vertebral arteries are normal to the skull base. Visualized intracranial arteries are normal. IMPRESSION: Normal MRA of the neck. Electronically Signed   By: Ulyses Jarred M.D.   On: 09/18/2019 23:25   ECHOCARDIOGRAM COMPLETE  Result Date: 09/19/2019   ECHOCARDIOGRAM REPORT   Patient Name:   Brittany Guerra [facility] Date of Exam: 09/19/2019 Medical Rec #:  [id number]         Height:       67.0 in Accession #:    [id number]        Weight:       140.4 lb Date of Birth:   1956/01/07          BSA:          1.74 m Patient Age:    22 years          BP:           152/80 mmHg Patient Gender: F                 HR:           59 bpm. Exam Location:  [facility] Procedure: 2D Echo, Color Doppler and Cardiac Doppler Indications:     I163.9 Stroke  History:         Patient has no prior history of Echocardiogram examinations.                  Risk Factors:Hypertension and HCL.  Sonographer:     Charmayne Sheer  RDCS (AE) Referring Phys:  [id number] Nicolette Bang Diagnosing Phys: Yolonda Kida MD  Sonographer Comments: Suboptimal apical window. IMPRESSIONS  1. Left ventricular ejection fraction, by visual estimation, is 55 to 60%. The left ventricle has normal function. Left ventricular septal wall thickness was normal. Normal left ventricular posterior wall thickness. There is no left ventricular hypertrophy.  2. The left ventricle has no regional wall motion abnormalities.  3. Global right ventricle has normal systolic function.The right ventricular size is normal. No increase in right ventricular wall thickness.  4. Left atrial size was normal.  5. Right atrial size was normal.  6. The mitral valve is normal in structure. Trivial mitral valve regurgitation.  7. The tricuspid valve is normal in structure.  8. The aortic valve is normal in structure. Aortic valve regurgitation is not visualized.  9. The pulmonic valve was grossly normal. Pulmonic valve regurgitation is not visualized. FINDINGS  Left Ventricle: Left ventricular ejection fraction, by visual estimation, is 55 to 60%. The left ventricle has normal function. The left ventricle has no regional wall motion abnormalities. Normal left ventricular posterior wall thickness. There is no left ventricular hypertrophy. Right Ventricle: The right ventricular size is normal. No increase in right ventricular wall thickness. Global RV systolic function is has normal systolic function. Left Atrium: Left atrial size was normal in size. Right Atrium:  Right atrial size was normal in size Pericardium: There is no evidence of pericardial effusion. Mitral Valve: The mitral valve is normal in structure. Trivial mitral valve regurgitation. MV peak gradient, 3.2 mmHg. Tricuspid Valve: The tricuspid valve is normal in structure. Tricuspid valve regurgitation is not demonstrated. Aortic Valve: The aortic valve is normal in structure. Aortic valve regurgitation is not visualized. Aortic valve mean gradient measures 4.0 mmHg. Aortic valve peak gradient measures 7.6 mmHg. Aortic valve area, by VTI measures 2.13 cm. Pulmonic Valve: The pulmonic valve was grossly normal. Pulmonic valve regurgitation is not visualized. Pulmonic regurgitation is not visualized. Aorta: The aortic root is normal in size and structure. IAS/Shunts: No atrial level shunt detected by color flow Doppler.  LEFT VENTRICLE PLAX 2D LVIDd:         3.89 cm  Diastology LVIDs:         2.75 cm  LV e' lateral:   10.10 cm/s LV PW:         0.88 cm  LV E/e' lateral: 8.0 LV IVS:        0.74 cm  LV e' medial:    6.42 cm/s LVOT diam:     1.90 cm  LV E/e' medial:  12.6 LV SV:         37 ml LV SV Index:   21.45 LVOT Area:     2.84 cm  RIGHT VENTRICLE RV Basal diam:  2.61 cm LEFT ATRIUM             Index       RIGHT ATRIUM           Index LA diam:        2.70 cm 1.55 cm/m  RA Area:     11.50 cm LA Vol (A2C):   45.2 ml 25.98 ml/m RA Volume:   27.50 ml  15.80 ml/m LA Vol (A4C):   22.3 ml 12.82 ml/m LA Biplane Vol: 34.7 ml 19.94 ml/m  AORTIC VALVE                   PULMONIC VALVE AV Area (Vmax):  2.18 cm    PV Vmax:       0.87 m/s AV Area (Vmean):   2.18 cm    PV Vmean:      61.200 cm/s AV Area (VTI):     2.13 cm    PV VTI:        0.170 m AV Vmax:           138.00 cm/s PV Peak grad:  3.0 mmHg AV Vmean:          93.600 cm/s PV Mean grad:  2.0 mmHg AV VTI:            0.299 m AV Peak Grad:      7.6 mmHg AV Mean Grad:      4.0 mmHg LVOT Vmax:         106.00 cm/s LVOT Vmean:        72.000 cm/s LVOT VTI:           0.225 m LVOT/AV VTI ratio: 0.75  AORTA Ao Root diam: 3.10 cm MITRAL VALVE MV Area (PHT): 3.21 cm             SHUNTS MV Peak grad:  3.2 mmHg             Systemic VTI:  0.22 m MV Mean grad:  1.0 mmHg             Systemic Diam: 1.90 cm MV Vmax:       0.90 m/s MV Vmean:      52.1 cm/s MV VTI:        0.29 m MV PHT:        68.44 msec MV Decel Time: 236 msec MV E velocity: 81.00 cm/s 103 cm/s MV A velocity: 74.70 cm/s 70.3 cm/s MV E/A ratio:  1.08       1.5  Dwayne D Callwood MD Electronically signed by Yolonda Kida MD Signature Date/Time: 09/19/2019/2:30:10 PM    Final      Time spent: 35 minutes  Author: Berle Mull, MD [facility] 09/19/2019 6:57 PM  To reach On-call, see care teams to locate the attending and reach out to them via [email or web address]. If 7PM-7AM, please contact night-coverage If you still have difficulty reaching the attending provider, please page the [facility] (Director on Call) for [facility] on amion for assistance.

## 2019-09-19 NOTE — Progress Notes (Signed)
During admission patient requested to keep home medications at bedside. She stated "I've had them since the ER, and I was down there 2 days, there is no reason to send them down if I am being discharged tomorrow." Patient educated on reasons why we store them, but refused to have them stored at this time. Patient is alert and oriented x4. RN Gerald Stabs notified as well as AD Tiffany. Lupita Leash

## 2019-09-20 DIAGNOSIS — H8113 Benign paroxysmal vertigo, bilateral: Secondary | ICD-10-CM | POA: Diagnosis present

## 2019-09-20 DIAGNOSIS — H5500 Unspecified nystagmus: Secondary | ICD-10-CM

## 2019-09-20 DIAGNOSIS — F319 Bipolar disorder, unspecified: Secondary | ICD-10-CM

## 2019-09-20 DIAGNOSIS — I10 Essential (primary) hypertension: Secondary | ICD-10-CM

## 2019-09-20 DIAGNOSIS — R42 Dizziness and giddiness: Secondary | ICD-10-CM

## 2019-09-20 LAB — BASIC METABOLIC PANEL
Anion gap: 9 (ref 5–15)
BUN: 20 mg/dL (ref 8–23)
CO2: 21 mmol/L — ABNORMAL LOW (ref 22–32)
Calcium: 9.7 mg/dL (ref 8.9–10.3)
Chloride: 108 mmol/L (ref 98–111)
Creatinine, Ser: 1.05 mg/dL — ABNORMAL HIGH (ref 0.44–1.00)
GFR calc Af Amer: >60 mL/min (ref >60)
GFR calc non Af Amer: 56 mL/min — ABNORMAL LOW (ref >60)
Glucose, Bld: 109 mg/dL — ABNORMAL HIGH (ref 70–99)
Potassium: 4 mmol/L (ref 3.5–5.1)
Sodium: 138 mmol/L (ref 135–145)

## 2019-09-20 LAB — CBC
HCT: 34.8 % — ABNORMAL LOW (ref 36.0–46.0)
Hemoglobin: 11.8 g/dL — ABNORMAL LOW (ref 12.0–15.0)
MCH: 31.6 pg (ref 26.0–34.0)
MCHC: 33.9 g/dL (ref 30.0–36.0)
MCV: 93 fL (ref 80.0–100.0)
Platelets: 227 10*3/uL (ref 150–400)
RBC: 3.74 MIL/uL — ABNORMAL LOW (ref 3.87–5.11)
RDW: 11.9 % (ref 11.5–15.5)
WBC: 4.6 10*3/uL (ref 4.0–10.5)
nRBC: 0 % (ref 0.0–0.2)

## 2019-09-20 LAB — MAGNESIUM: Magnesium: 2.1 mg/dL (ref 1.7–2.4)

## 2019-09-20 MED ORDER — TRAZODONE HCL 50 MG PO TABS
50.0000 mg | ORAL_TABLET | Freq: Every evening | ORAL | Status: DC | PRN
  Administered 2019-09-20: 50 mg via ORAL
  Filled 2019-09-20: qty 1

## 2019-09-20 MED ORDER — MECLIZINE HCL 25 MG PO TABS: 25.0000 mg | ORAL_TABLET | Freq: Three times a day (TID) | ORAL | 0 refills | Status: DC | PRN

## 2019-09-20 NOTE — Progress Notes (Signed)
Received Md order to [facility] patient to home, reviewed home meds discharge instructions, prescriptions and follow up appointments with patient and patient verbalized understanding, patient also given DME walker by care management to go home with

## 2019-09-20 NOTE — Discharge Summary (Signed)
Physician Discharge Summary  Brittany Guerra [id number] DOB: April 14, 1956 DOA: 09/18/2019  PCP: Dion Body, MD  Admit date: 09/18/2019 Discharge date: 09/20/2019  Admitted From: Home Disposition: Home  Recommendations for Outpatient Follow-up:  1. Follow up with PCP in 2 weeks (has appointment in early February)   Home Health: None Equipment/Devices: None  Discharge Condition: Fair CODE STATUS: Full code Diet recommendation: Regular    Discharge Diagnoses:  Principal Problem:   BPPV (benign paroxysmal positional vertigo), bilateral  Active Problems:   Dizziness   Essential hypertension   Hyperlipidemia, mixed   Bipolar disorder (HCC)   Acquired hypothyroidism   Stroke-like symptom  Brief narrative/HPI 64 year old female with history of hypertension, hypothyroidism, bipolar disorder presented with dizziness and vertigo with concern for strokelike symptoms.  Admitted for further management.  Hospital course Dizziness and vertigo Likely secondary to benign positional vertigo with associated bilateral nystagmus. CT head, MRI brain negative for acute stroke and shows mild chronic ischemic changes.  2D echo shows normal EF with no wall motion abnormality. Patient placed on meclizine with clinical improvement.  Orthostasis negative. Seen by PT and no further follow-up needed. We will discharge her on as needed meclizine.  She has mild nystagmus on exam today and dizziness has markedly improved. Ordered for rolling walker.  Essential HTN  resume metoprolol  Bipolar disorder continue home meds  Hypothyroidism Continue synthroid  procedures  CT head, MRI brain, echo  Consult neurology  Disposition: home  family communication: none   Discharge Instructions  Discharge Instructions    For home use only DME Walker rolling   Complete by: As directed    Walker: With 5 Inch Wheels   Patient needs a walker to treat with the following condition: Postural  dizziness with near syncope     Allergies as of 09/20/2019      Reactions   Sulfa Antibiotics Hives      Medication List    TAKE these medications   albuterol 108 (90 Base) MCG/ACT inhaler Commonly known as: VENTOLIN HFA Inhale into the lungs every 6 (six) hours as needed for wheezing or shortness of breath.   aspirin 81 MG tablet Take 81 mg by mouth daily.   buPROPion 100 MG 12 hr tablet Commonly known as: WELLBUTRIN SR Take 100 mg by mouth 2 (two) times daily.   esomeprazole 20 MG capsule Commonly known as: NEXIUM Take 20 mg by mouth daily.   levothyroxine 88 MCG tablet Commonly known as: SYNTHROID Take 88 mcg by mouth daily. Take on an empty stomach with a glass of water at least 30-60 minutes before breakfast.   lithium 300 MG tablet Take 600 mg by mouth daily. Takes at bedtime   LORazepam 1 MG tablet Commonly known as: ATIVAN Take 1 mg by mouth 2 (two) times daily as needed for anxiety.   meclizine 25 MG tablet Commonly known as: ANTIVERT Take 1 tablet (25 mg total) by mouth 3 (three) times daily as needed for dizziness.   metoprolol tartrate 25 MG tablet Commonly known as: LOPRESSOR Take 25 mg by mouth 2 (two) times daily.   risperiDONE 1 MG tablet Commonly known as: RISPERDAL Take 1 mg by mouth at bedtime.   sucralfate 1 g tablet Commonly known as: CARAFATE Take 1 g by mouth 4 (four) times daily.   tolterodine 4 MG 24 hr capsule Commonly known as: DETROL LA Take 4 mg by mouth daily.            Durable Medical Equipment  (  From admission, onward)         Start     Ordered   09/20/19 0000  For home use only DME Walker rolling    Question Answer Comment  Walker: With 5 Inch Wheels   Patient needs a walker to treat with the following condition Postural dizziness with near syncope      09/20/19 0928          Allergies  Allergen Reactions  . Sulfa Antibiotics Hives        Procedures/Studies: MR ANGIO NECK W WO CONTRAST  Result  Date: 09/18/2019 CLINICAL DATA:  Vertigo EXAM: MRA NECK WITHOUT AND WITH CONTRAST TECHNIQUE: Multiplanar and multiecho pulse sequences of the neck were obtained without and with intravenous contrast. Angiographic images of the neck were obtained using MRA technique without and with intravenous contrast. CONTRAST:  42mL GADAVIST GADOBUTROL 1 MMOL/ML IV SOLN COMPARISON:  None. FINDINGS: Normal appearance of the aorta and arch vessels. No carotid stenosis, occlusion or other abnormality. Both vertebral arteries are normal to the skull base. Visualized intracranial arteries are normal. IMPRESSION: Normal MRA of the neck. Electronically Signed   By: Ulyses Jarred M.D.   On: 09/18/2019 23:25   MR BRAIN WO CONTRAST  Result Date: 09/19/2019 CLINICAL DATA:  Dizziness and vertigo EXAM: MRI HEAD WITHOUT CONTRAST TECHNIQUE: Multiplanar, multiecho pulse sequences of the brain and surrounding structures were obtained without intravenous contrast. COMPARISON:  Correlation made with prior CT FINDINGS: Brain: There is no acute infarction or intracranial hemorrhage. There is no intracranial mass, mass effect, or edema. There is no hydrocephalus or extra-axial fluid collection. Ventricles and sulci are within normal limits in size and configuration. Patchy and confluent areas of T2 hyperintensity in the supratentorial white matter are nonspecific but may reflect moderate chronic microvascular ischemic changes. Vascular: Major vessel flow voids at the skull base are preserved. Skull and upper cervical spine: Normal marrow signal is preserved. Sinuses/Orbits: Minor mucosal thickening.  Orbits are unremarkable. Other: Sella is unremarkable.  Mastoid air cells are clear. IMPRESSION: No evidence of recent infarction, intracranial hemorrhage, or mass. Probable moderate chronic microvascular ischemic changes. Electronically Signed   By: Macy Mis M.D.   On: 09/19/2019 21:57   ECHOCARDIOGRAM COMPLETE  Result Date: 09/19/2019    ECHOCARDIOGRAM REPORT   Patient Name:   Brittany Guerra [facility] Date of Exam: 09/19/2019 Medical Rec #:  [id number]         Height:       67.0 in Accession #:    [id number]        Weight:       140.4 lb Date of Birth:  01-Sep-1956          BSA:          1.74 m Patient Age:    64 years          BP:           152/80 mmHg Patient Gender: F                 HR:           59 bpm. Exam Location:  [facility] Procedure: 2D Echo, Color Doppler and Cardiac Doppler Indications:     I163.9 Stroke  History:         Patient has no prior history of Echocardiogram examinations.                  Risk Factors:Hypertension and HCL.  Sonographer:  Charmayne Sheer RDCS (AE) Referring Phys:  [id number] Nicolette Bang Diagnosing Phys: Yolonda Kida MD  Sonographer Comments: Suboptimal apical window. IMPRESSIONS  1. Left ventricular ejection fraction, by visual estimation, is 55 to 60%. The left ventricle has normal function. Left ventricular septal wall thickness was normal. Normal left ventricular posterior wall thickness. There is no left ventricular hypertrophy.  2. The left ventricle has no regional wall motion abnormalities.  3. Global right ventricle has normal systolic function.The right ventricular size is normal. No increase in right ventricular wall thickness.  4. Left atrial size was normal.  5. Right atrial size was normal.  6. The mitral valve is normal in structure. Trivial mitral valve regurgitation.  7. The tricuspid valve is normal in structure.  8. The aortic valve is normal in structure. Aortic valve regurgitation is not visualized.  9. The pulmonic valve was grossly normal. Pulmonic valve regurgitation is not visualized. FINDINGS  Left Ventricle: Left ventricular ejection fraction, by visual estimation, is 55 to 60%. The left ventricle has normal function. The left ventricle has no regional wall motion abnormalities. Normal left ventricular posterior wall thickness. There is no left ventricular hypertrophy. Right Ventricle: The  right ventricular size is normal. No increase in right ventricular wall thickness. Global RV systolic function is has normal systolic function. Left Atrium: Left atrial size was normal in size. Right Atrium: Right atrial size was normal in size Pericardium: There is no evidence of pericardial effusion. Mitral Valve: The mitral valve is normal in structure. Trivial mitral valve regurgitation. MV peak gradient, 3.2 mmHg. Tricuspid Valve: The tricuspid valve is normal in structure. Tricuspid valve regurgitation is not demonstrated. Aortic Valve: The aortic valve is normal in structure. Aortic valve regurgitation is not visualized. Aortic valve mean gradient measures 4.0 mmHg. Aortic valve peak gradient measures 7.6 mmHg. Aortic valve area, by VTI measures 2.13 cm. Pulmonic Valve: The pulmonic valve was grossly normal. Pulmonic valve regurgitation is not visualized. Pulmonic regurgitation is not visualized. Aorta: The aortic root is normal in size and structure. IAS/Shunts: No atrial level shunt detected by color flow Doppler.  LEFT VENTRICLE PLAX 2D LVIDd:         3.89 cm  Diastology LVIDs:         2.75 cm  LV e' lateral:   10.10 cm/s LV PW:         0.88 cm  LV E/e' lateral: 8.0 LV IVS:        0.74 cm  LV e' medial:    6.42 cm/s LVOT diam:     1.90 cm  LV E/e' medial:  12.6 LV SV:         37 ml LV SV Index:   21.45 LVOT Area:     2.84 cm  RIGHT VENTRICLE RV Basal diam:  2.61 cm LEFT ATRIUM             Index       RIGHT ATRIUM           Index LA diam:        2.70 cm 1.55 cm/m  RA Area:     11.50 cm LA Vol (A2C):   45.2 ml 25.98 ml/m RA Volume:   27.50 ml  15.80 ml/m LA Vol (A4C):   22.3 ml 12.82 ml/m LA Biplane Vol: 34.7 ml 19.94 ml/m  AORTIC VALVE                   PULMONIC VALVE AV Area (Vmax):  2.18 cm    PV Vmax:       0.87 m/s AV Area (Vmean):   2.18 cm    PV Vmean:      61.200 cm/s AV Area (VTI):     2.13 cm    PV VTI:        0.170 m AV Vmax:           138.00 cm/s PV Peak grad:  3.0 mmHg AV Vmean:           93.600 cm/s PV Mean grad:  2.0 mmHg AV VTI:            0.299 m AV Peak Grad:      7.6 mmHg AV Mean Grad:      4.0 mmHg LVOT Vmax:         106.00 cm/s LVOT Vmean:        72.000 cm/s LVOT VTI:          0.225 m LVOT/AV VTI ratio: 0.75  AORTA Ao Root diam: 3.10 cm MITRAL VALVE MV Area (PHT): 3.21 cm             SHUNTS MV Peak grad:  3.2 mmHg             Systemic VTI:  0.22 m MV Mean grad:  1.0 mmHg             Systemic Diam: 1.90 cm MV Vmax:       0.90 m/s MV Vmean:      52.1 cm/s MV VTI:        0.29 m MV PHT:        68.44 msec MV Decel Time: 236 msec MV E velocity: 81.00 cm/s 103 cm/s MV A velocity: 74.70 cm/s 70.3 cm/s MV E/A ratio:  1.08       1.5  Dwayne D Callwood MD Electronically signed by Yolonda Kida MD Signature Date/Time: 09/19/2019/2:30:10 PM    Final    CT HEAD CODE STROKE WO CONTRAST  Result Date: 09/18/2019 CLINICAL DATA:  Code stroke. EXAM: CT HEAD WITHOUT CONTRAST TECHNIQUE: Contiguous axial images were obtained from the base of the skull through the vertex without intravenous contrast. COMPARISON:  None. FINDINGS: Brain: There is no acute intracranial hemorrhage, mass effect, or edema. Gray-white differentiation remains preserved. There are patchy and confluent areas of hypoattenuation in the supratentorial white matter, which are nonspecific but probably reflect moderate chronic microvascular ischemic changes. Ventricles and sulci are within normal limits in size and configuration. There is no extra-axial fluid collection. Vascular: There is no hyperdense vessel. Mild intracranial atherosclerotic calcification is present at the skull base. Skull: Calvarium is unremarkable. Sinuses/Orbits: Imaged paranasal sinuses are clear. Orbits are unremarkable. Other: Mastoid air cells are clear. There is rightward deviation of the nasal septum. ASPECTS [facility] Stroke Program Early CT Score) - Ganglionic level infarction (caudate, lentiform nuclei, internal capsule, insula, M1-M3 cortex): 7 -  Supraganglionic infarction (M4-M6 cortex): 3 Total score (0-10 with 10 being normal): 10 IMPRESSION: 1. No acute intracranial hemorrhage or evidence of acute infarction. ASPECTS is 10. 2. Moderate chronic microvascular ischemic changes. These results were called by telephone at the time of interpretation on 09/18/2019 at 4:36 pm to provider Dr. Jacqualine Code, who verbally acknowledged these results. Electronically Signed   By: Macy Mis M.D.   On: 09/18/2019 16:41       Subjective: Dizziness improved. Able to ambulate better today  Discharge Exam: Vitals:   09/20/19 0010 09/20/19 0741  BP:  132/77 140/74  Pulse: 68 61  Resp: 17 17  Temp: 97.7 F (36.5 C) 98.2 F (36.8 C)  SpO2: 99% 96%   Vitals:   09/19/19 1938 09/19/19 2214 09/20/19 0010 09/20/19 0741  BP: (!) 165/90 (!) 148/71 132/77 140/74  Pulse: 76 71 68 61  Resp: 17 17 17 17   Temp: 98 F (36.7 C) 97.6 F (36.4 C) 97.7 F (36.5 C) 98.2 F (36.8 C)  TempSrc: Oral Oral Oral Oral  SpO2: 100% 100% 99% 96%  Weight:      Height:        General: not in acute distress HEENT: mild bilateral horizontal nystagmus, moist mucosa, supple neck  chest: clear CVS: N S1&S2 Gi: soft, NT, ND, Musculoskeletal: warm, no edema  CNS: AAOX3, non focal    The results of significant diagnostics from this hospitalization (including imaging, microbiology, ancillary and laboratory) are listed below for reference.     Microbiology: Recent Results (from the past 240 hour(s))  SARS CORONAVIRUS 2 (TAT 6-24 HRS) Nasopharyngeal Nasopharyngeal Swab     Status: None   Collection Time: 09/18/19  5:47 PM   Specimen: Nasopharyngeal Swab  Result Value Ref Range Status   SARS Coronavirus 2 NEGATIVE NEGATIVE Final    Comment: (NOTE) SARS-CoV-2 target nucleic acids are NOT DETECTED. The SARS-CoV-2 RNA is generally detectable in upper and lower respiratory specimens during the acute phase of infection. Negative results do not preclude SARS-CoV-2  infection, do not rule out co-infections with other pathogens, and should not be used as the sole basis for treatment or other patient management decisions. Negative results must be combined with clinical observations, patient history, and epidemiological information. The expected result is Negative. Fact Sheet for Patients: SugarRoll.be Fact Sheet for Healthcare Providers: https://www.woods-mathews.com/ This test is not yet approved or cleared by the [place] FDA and  has been authorized for detection and/or diagnosis of SARS-CoV-2 by FDA under an Emergency Use Authorization (EUA). This EUA will remain  in effect (meaning this test can be used) for the duration of the COVID-19 declaration under Section 56 4(b)(1) of the Act, 21 U.S.C. section 360bbb-3(b)(1), unless the authorization is terminated or revoked sooner. Performed at [facility], [place] [address], [place], [place] [address]      Labs: BNP (last 3 results) No results for input(s): BNP in the last 8760 hours. Basic Metabolic Panel: Recent Labs  Lab 09/18/19 1648 09/20/19 0433  NA 134* 138  K 4.0 4.0  CL 103 108  CO2 22 21*  GLUCOSE 95 109*  BUN 19 20  CREATININE 1.09* 1.05*  CALCIUM 10.0 9.7  MG  --  2.1   Liver Function Tests: Recent Labs  Lab 09/18/19 1648  AST 18  ALT 16  ALKPHOS 60  BILITOT 0.7  PROT 7.4  ALBUMIN 4.5   No results for input(s): LIPASE, AMYLASE in the last 168 hours. No results for input(s): AMMONIA in the last 168 hours. CBC: Recent Labs  Lab 09/18/19 1648 09/20/19 0433  WBC 7.1 4.6  NEUTROABS 4.3  --   HGB 12.3 11.8*  HCT 35.3* 34.8*  MCV 91.7 93.0  PLT 266 227   Cardiac Enzymes: No results for input(s): CKTOTAL, CKMB, CKMBINDEX, TROPONINI in the last 168 hours. BNP: Invalid input(s): POCBNP CBG: Recent Labs  Lab 09/18/19 1623  GLUCAP 86   D-Dimer No results for input(s): DDIMER in the last 72 hours. Hgb  A1c Recent Labs    09/19/19 1302  HGBA1C 4.8  Lipid Profile Recent Labs    09/19/19 1418  CHOL 280*  HDL 76  LDLCALC 168*  TRIG 181*  CHOLHDL 3.7   Thyroid function studies No results for input(s): TSH, T4TOTAL, T3FREE, THYROIDAB in the last 72 hours.  Invalid input(s): FREET3 Anemia work up No results for input(s): VITAMINB12, FOLATE, FERRITIN, TIBC, IRON, RETICCTPCT in the last 72 hours. Urinalysis    Component Value Date/Time   COLORURINE YELLOW 09/24/2013 1020   APPEARANCEUR CLOUDY 09/24/2013 1020   LABSPEC 1.010 09/24/2013 1020   PHURINE 6.0 09/24/2013 1020   GLUCOSEU NEGATIVE 09/24/2013 1020   HGBUR NEGATIVE 09/24/2013 1020   BILIRUBINUR NEGATIVE 09/24/2013 1020   KETONESUR NEGATIVE 09/24/2013 1020   PROTEINUR NEGATIVE 09/24/2013 1020   NITRITE NEGATIVE 09/24/2013 1020   LEUKOCYTESUR NEGATIVE 09/24/2013 1020   Sepsis Labs Invalid input(s): PROCALCITONIN,  WBC,  LACTICIDVEN Microbiology Recent Results (from the past 240 hour(s))  SARS CORONAVIRUS 2 (TAT 6-24 HRS) Nasopharyngeal Nasopharyngeal Swab     Status: None   Collection Time: 09/18/19  5:47 PM   Specimen: Nasopharyngeal Swab  Result Value Ref Range Status   SARS Coronavirus 2 NEGATIVE NEGATIVE Final    Comment: (NOTE) SARS-CoV-2 target nucleic acids are NOT DETECTED. The SARS-CoV-2 RNA is generally detectable in upper and lower respiratory specimens during the acute phase of infection. Negative results do not preclude SARS-CoV-2 infection, do not rule out co-infections with other pathogens, and should not be used as the sole basis for treatment or other patient management decisions. Negative results must be combined with clinical observations, patient history, and epidemiological information. The expected result is Negative. Fact Sheet for Patients: SugarRoll.be Fact Sheet for Healthcare Providers: https://www.woods-mathews.com/ This test is not yet  approved or cleared by the [place] FDA and  has been authorized for detection and/or diagnosis of SARS-CoV-2 by FDA under an Emergency Use Authorization (EUA). This EUA will remain  in effect (meaning this test can be used) for the duration of the COVID-19 declaration under Section 56 4(b)(1) of the Act, 21 U.S.C. section 360bbb-3(b)(1), unless the authorization is terminated or revoked sooner. Performed at [facility], [place] [address] [place], [place] [address]      Time coordinating discharge: 35 minutes  SIGNED:   Louellen Molder, MD  [facility] 09/20/2019, 9:29 AM Pager   If 7PM-7AM, please contact night-coverage [email or web address] Password [email or web address]

## 2019-09-20 NOTE — Discharge Instructions (Signed)
Benign Positional Vertigo Vertigo is the feeling that you or your surroundings are moving when they are not. Benign positional vertigo is the most common form of vertigo. This is usually a harmless condition (benign). This condition is positional. This means that symptoms are triggered by certain movements and positions. This condition can be dangerous if it occurs while you are doing something that could cause harm to you or others. This includes activities such as driving or operating machinery. What are the causes? In many cases, the cause of this condition is not known. It may be caused by a disturbance in an area of the inner ear that helps your brain to sense movement and balance. This disturbance can be caused by:  Viral infection (labyrinthitis).  Head injury.  Repetitive motion, such as jumping, dancing, or running. What increases the risk? You are more likely to develop this condition if:  You are a woman.  You are 50 years of age or older. What are the signs or symptoms? Symptoms of this condition usually happen when you move your head or your eyes in different directions. Symptoms may start suddenly, and usually last for less than a minute. They include:  Loss of balance and falling.  Feeling like you are spinning or moving.  Feeling like your surroundings are spinning or moving.  Nausea and vomiting.  Blurred vision.  Dizziness.  Involuntary eye movement (nystagmus). Symptoms can be mild and cause only minor problems, or they can be severe and interfere with daily life. Episodes of benign positional vertigo may return (recur) over time. Symptoms may improve over time. How is this diagnosed? This condition may be diagnosed based on:  Your medical history.  Physical exam of the head, neck, and ears.  Tests, such as: ? MRI. ? CT scan. ? Eye movement tests. Your health care provider may ask you to change positions quickly while he or she watches you for symptoms  of benign positional vertigo, such as nystagmus. Eye movement may be tested with a variety of exams that are designed to evaluate or stimulate vertigo. ? An electroencephalogram (EEG). This records electrical activity in your brain. ? Hearing tests. You may be referred to a health care provider who specializes in ear, nose, and throat (ENT) problems (otolaryngologist) or a provider who specializes in disorders of the nervous system (neurologist). How is this treated?  This condition may be treated in a session in which your health care provider moves your head in specific positions to adjust your inner ear back to normal. Treatment for this condition may take several sessions. Surgery may be needed in severe cases, but this is rare. In some cases, benign positional vertigo may resolve on its own in 2-4 weeks. Follow these instructions at home: Safety  Move slowly. Avoid sudden body or head movements or certain positions, as told by your health care provider.  Avoid driving until your health care provider says it is safe for you to do so.  Avoid operating heavy machinery until your health care provider says it is safe for you to do so.  Avoid doing any tasks that would be dangerous to you or others if vertigo occurs.  If you have trouble walking or keeping your balance, try using a cane for stability. If you feel dizzy or unstable, sit down right away.  Return to your normal activities as told by your health care provider. Ask your health care provider what activities are safe for you. General instructions  Take over-the-counter   and prescription medicines only as told by your health care provider.  Drink enough fluid to keep your urine pale yellow.  Keep all follow-up visits as told by your health care provider. This is important. Contact a health care provider if:  You have a fever.  Your condition gets worse or you develop new symptoms.  Your family or friends notice any  behavioral changes.  You have nausea or vomiting that gets worse.  You have numbness or a "pins and needles" sensation. Get help right away if you:  Have difficulty speaking or moving.  Are always dizzy.  Faint.  Develop severe headaches.  Have weakness in your legs or arms.  Have changes in your hearing or vision.  Develop a stiff neck.  Develop sensitivity to light. Summary  Vertigo is the feeling that you or your surroundings are moving when they are not. Benign positional vertigo is the most common form of vertigo.  The cause of this condition is not known. It may be caused by a disturbance in an area of the inner ear that helps your brain to sense movement and balance.  Symptoms include loss of balance and falling, feeling that you or your surroundings are moving, nausea and vomiting, and blurred vision.  This condition can be diagnosed based on symptoms, physical exam, and other tests, such as MRI, CT scan, eye movement tests, and hearing tests.  Follow safety instructions as told by your health care provider. You will also be told when to contact your health care provider in case of problems. This information is not intended to replace advice given to you by your health care provider. Make sure you discuss any questions you have with your health care provider. Document Revised: 02/09/2018 Document Reviewed: 02/09/2018 Elsevier Patient Education  2020 Elsevier Inc.  

## 2019-09-20 NOTE — TOC Transition Note (Signed)
Transition of Care [facility]) - CM/SW Discharge Note   Patient Details  Name: Brittany Guerra MRN: [id number] Date of Birth: September 05, 1956  [facility] CM/SW Contact:  Su Hilt, RN Phone Number: 09/20/2019, 11:46 AM   Clinical Narrative:     Patient lives at home and is independent, she was provided a RW by [employer], no additional needs        Patient Goals and CMS Choice        Discharge Placement                       Discharge Plan and Services                                     Social Determinants of Health (SDOH) Interventions     Readmission Risk Interventions No flowsheet data found.

## 2019-11-14 ENCOUNTER — Ambulatory Visit: Admitting: Physical Therapy

## 2019-11-21 ENCOUNTER — Encounter: Admitting: Physical Therapy

## 2019-11-28 ENCOUNTER — Encounter: Admitting: Physical Therapy

## 2019-12-05 ENCOUNTER — Encounter: Admitting: Physical Therapy

## 2019-12-12 ENCOUNTER — Ambulatory Visit

## 2019-12-19 ENCOUNTER — Encounter: Admitting: Physical Therapy

## 2019-12-26 ENCOUNTER — Encounter: Admitting: Physical Therapy

## 2020-01-02 ENCOUNTER — Encounter: Admitting: Physical Therapy

## 2020-01-09 ENCOUNTER — Encounter: Admitting: Physical Therapy

## 2020-12-05 IMAGING — CT CT HEAD CODE STROKE
3 series · 15 of 46 positions shown, 18 images · non-contrast
Comparison: None.

CLINICAL DATA: Code stroke.

EXAM:
CT HEAD WITHOUT CONTRAST
TECHNIQUE: Contiguous axial images were obtained from the base of the skull
through the vertex without intravenous contrast.

[Series 2: head wo · axial · 0.47mm/px · z∈[-144,-24]mm · 9 of 29 slices shown, 12 images]
[im 3/29  brain]
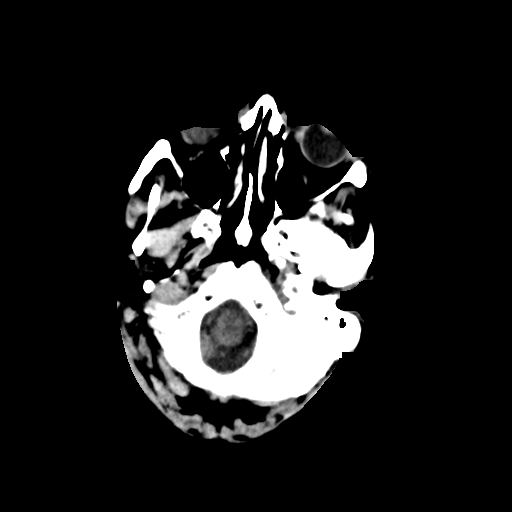
[im 3/29  bone]
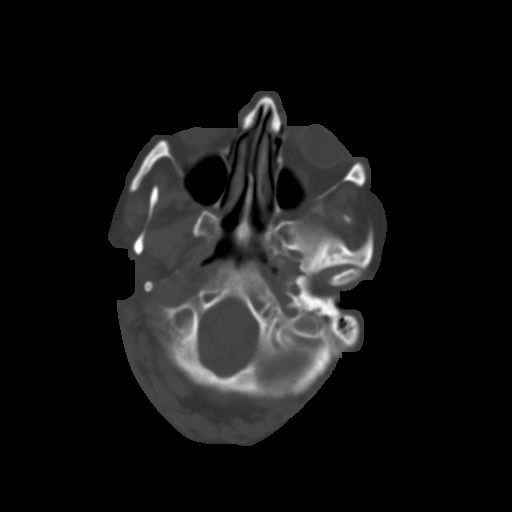
[im 6/29  brain]
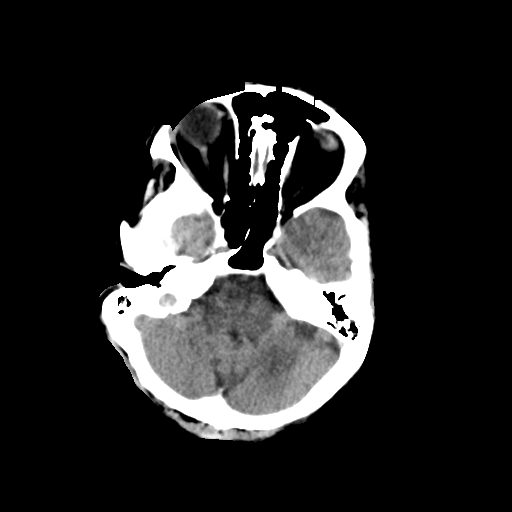
[im 9/29  brain]
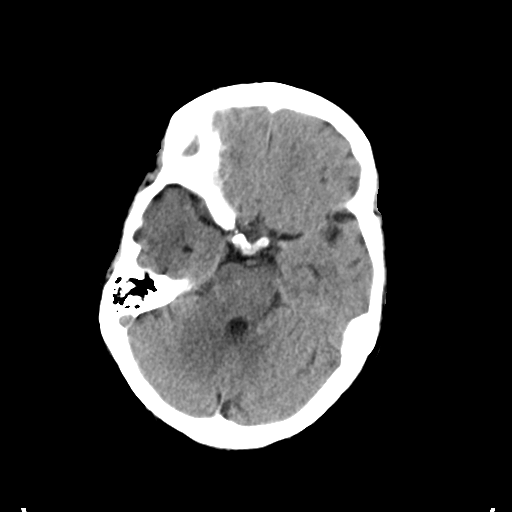
[im 12/29  brain]
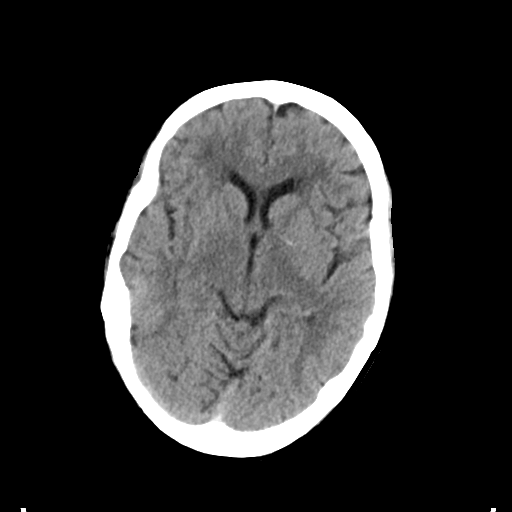
[im 15/29  brain]
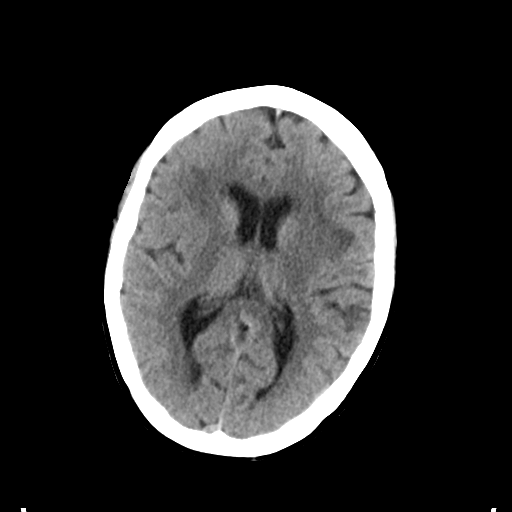
[im 15/29  bone]
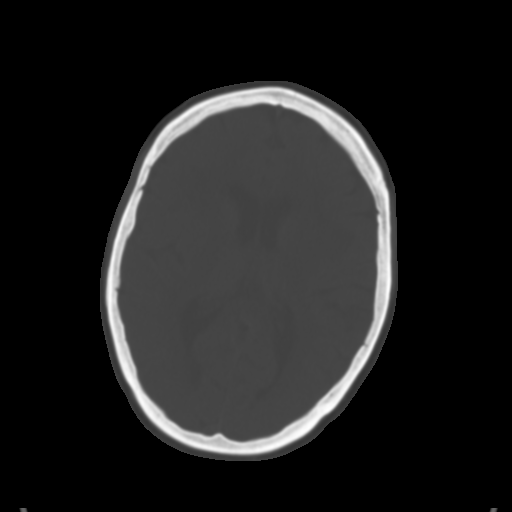
[im 18/29  brain]
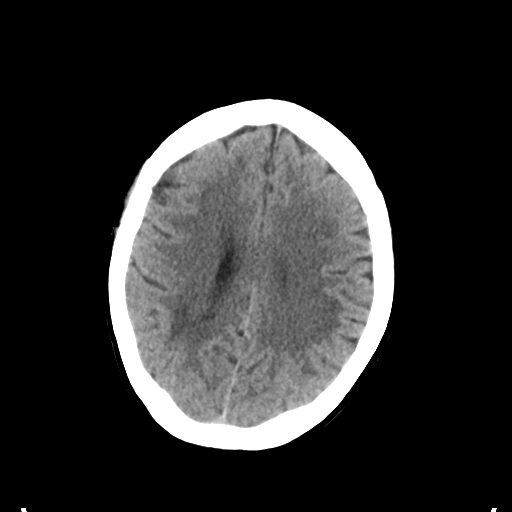
[im 21/29  brain]
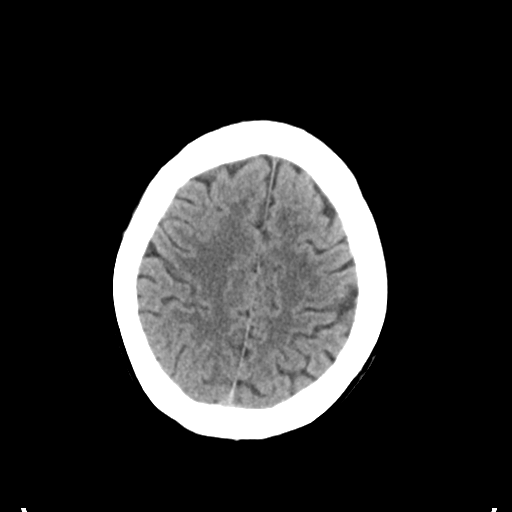
[im 24/29  brain]
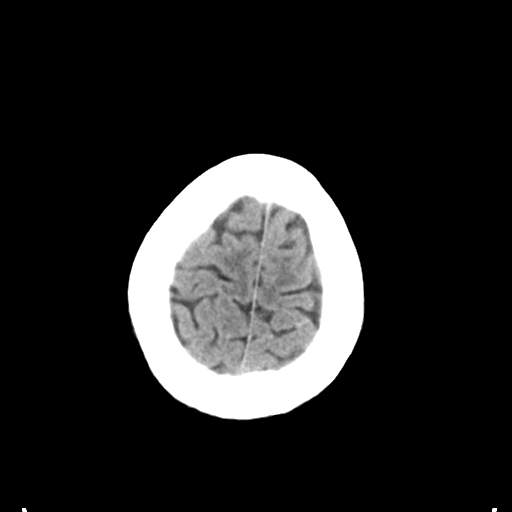
[im 27/29  brain]
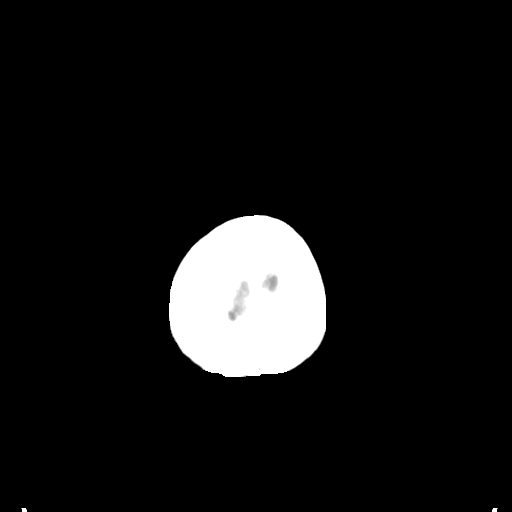
[im 27/29  bone]
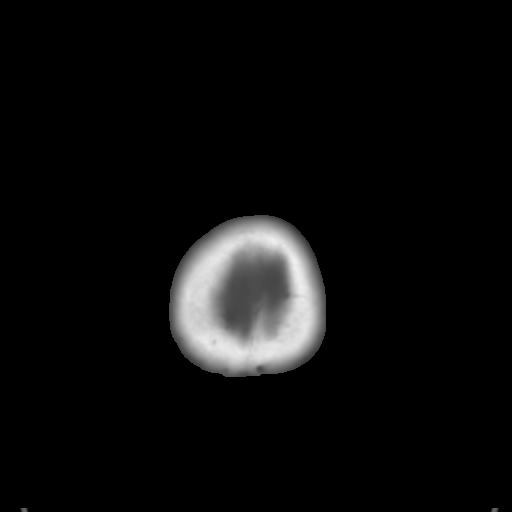

[Series 4: coronal soft tissue · coronal · 0.29mm/px · 3 of 64 slices shown]
[im 22/64  brain]
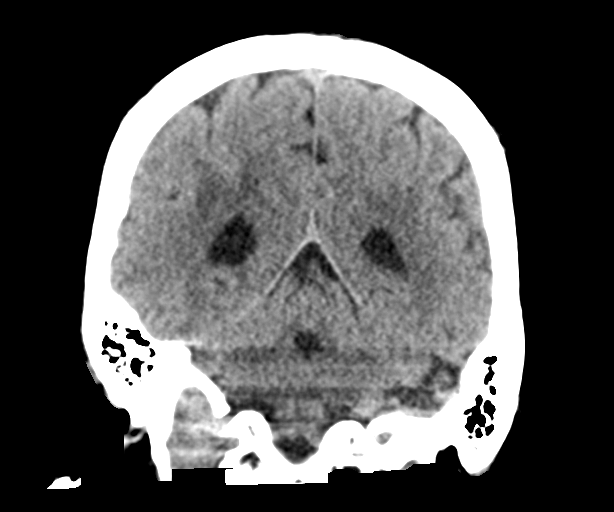
[im 29/64  brain]
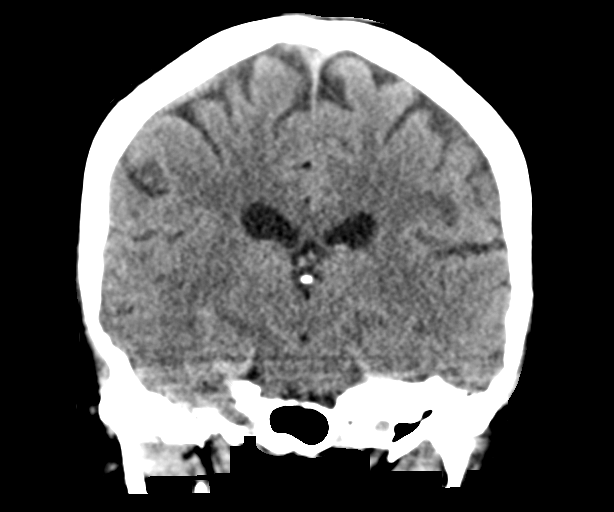
[im 36/64  brain]
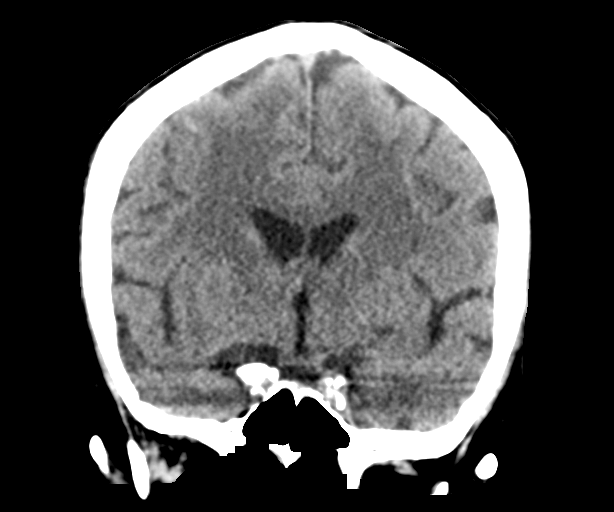

[Series 5: sagittal soft tissue · sagittal · 0.29mm/px · 3 of 55 slices shown]
[im 19/55  brain]
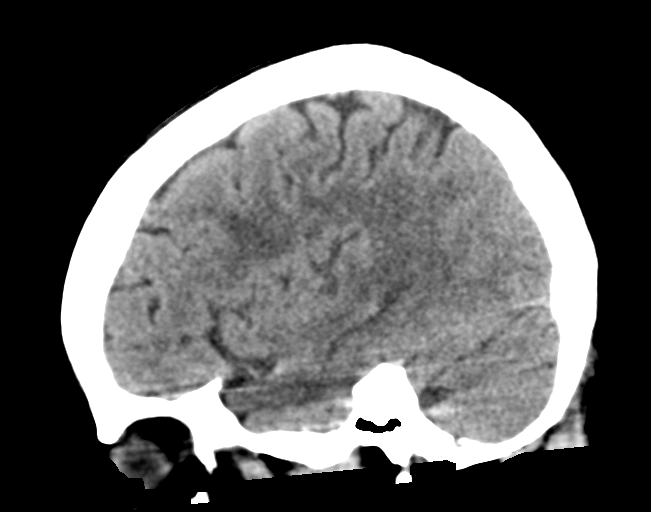
[im 28/55  brain]
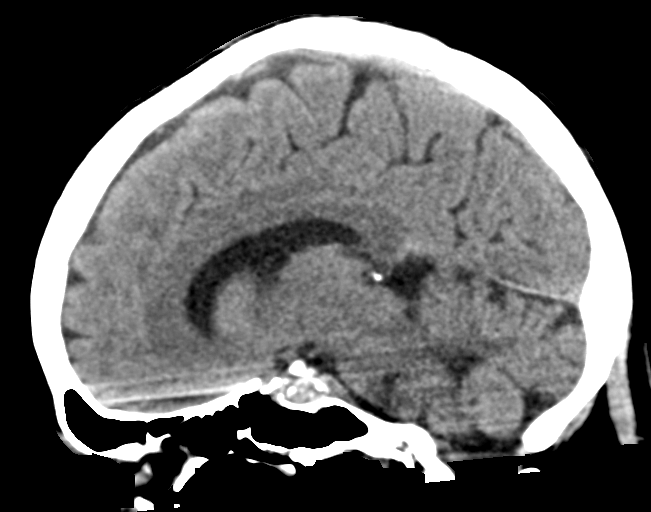
[im 37/55  brain]
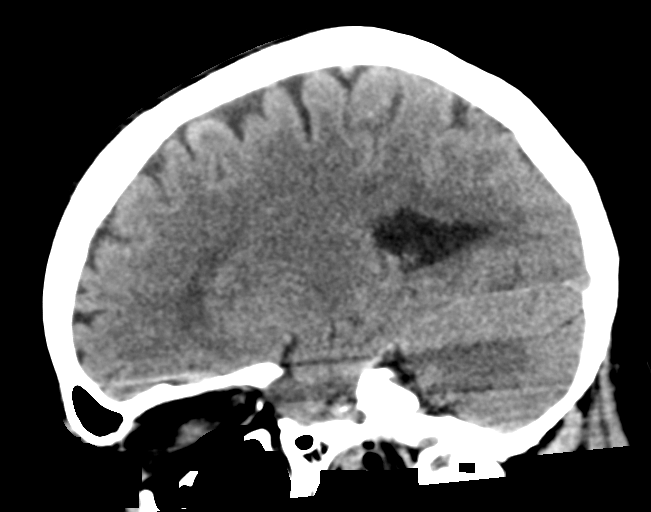

[15 of 46 positions shown; findings below may reference images not displayed]

FINDINGS: Brain: There is no acute intracranial hemorrhage, mass effect, or
edema. Gray-white differentiation remains preserved. There are
patchy and confluent areas of hypoattenuation in the supratentorial
white matter, which are nonspecific but probably reflect moderate
chronic microvascular ischemic changes. Ventricles and sulci are
within normal limits in size and configuration. There is no
extra-axial fluid collection.

Vascular: There is no hyperdense vessel. Mild intracranial
atherosclerotic calcification is present at the skull base.

Skull: Calvarium is unremarkable.

Sinuses/Orbits: Imaged paranasal sinuses are clear. Orbits are
unremarkable.

Other: Mastoid air cells are clear. There is rightward deviation of
the nasal septum.

ASPECTS ([place] Stroke Program Early CT Score)

- Ganglionic level infarction (caudate, lentiform nuclei, internal
capsule, insula, M1-M3 cortex): 7

- Supraganglionic infarction (M4-M6 cortex): 3

Total score (0-10 with 10 being normal): 10
IMPRESSION: 1. No acute intracranial hemorrhage or evidence of acute infarction.
ASPECTS is 10.
2. Moderate chronic microvascular ischemic changes.

These results were called by telephone at the time of interpretation
on 09/18/2019 at [DATE] to provider Dr. Nadarajah, who verbally
acknowledged these results.

## 2023-08-15 DEATH — deceased
